# Patient Record
Sex: Male | Born: 1965 | Race: Black or African American | Hispanic: No | Marital: Single | Smoking: Current some day smoker
Health system: Southern US, Community
[De-identification: ages and names within clinical notes are randomized; demographics above are authoritative.]

## PROBLEM LIST (undated history)

## (undated) DIAGNOSIS — R569 Unspecified convulsions: Secondary | ICD-10-CM

## (undated) DIAGNOSIS — F439 Reaction to severe stress, unspecified: Secondary | ICD-10-CM

## (undated) DIAGNOSIS — F419 Anxiety disorder, unspecified: Secondary | ICD-10-CM

## (undated) HISTORY — DX: Unspecified convulsions: R56.9

## (undated) HISTORY — DX: Anxiety disorder, unspecified: F41.9

## (undated) HISTORY — DX: Reaction to severe stress, unspecified: F43.9

---

## 2005-12-07 ENCOUNTER — Other Ambulatory Visit: Payer: Self-pay

## 2005-12-07 ENCOUNTER — Emergency Department: Payer: Self-pay | Admitting: Internal Medicine

## 2006-03-25 ENCOUNTER — Emergency Department: Payer: Self-pay | Admitting: Emergency Medicine

## 2006-03-25 ENCOUNTER — Other Ambulatory Visit: Payer: Self-pay

## 2006-05-14 ENCOUNTER — Emergency Department: Payer: Self-pay | Admitting: General Practice

## 2006-05-14 ENCOUNTER — Other Ambulatory Visit: Payer: Self-pay

## 2007-09-30 ENCOUNTER — Emergency Department: Payer: Self-pay | Admitting: Emergency Medicine

## 2008-03-13 IMAGING — CT CT HEAD WITHOUT CONTRAST
2 series · 16 of 30 positions shown, 20 images · non-contrast
Comparison: none

REASON FOR EXAM: syncope,seizure
COMMENTS:

[Series 2: without · axial · non-contrast · 0.43mm/px · z∈[+553,+678]mm · 13 of 31 slices shown, 17 images]
[im 3/31  brain]
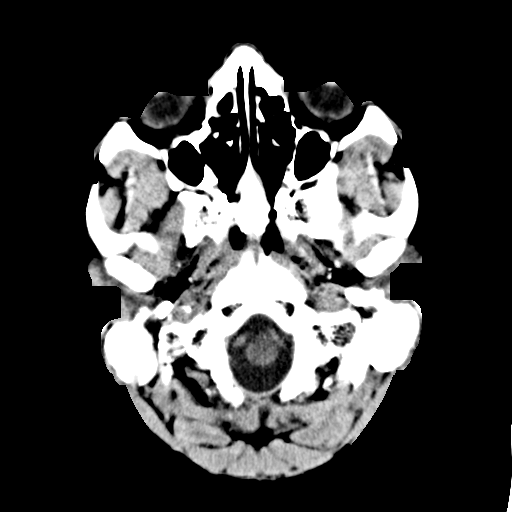
[im 3/31  bone]
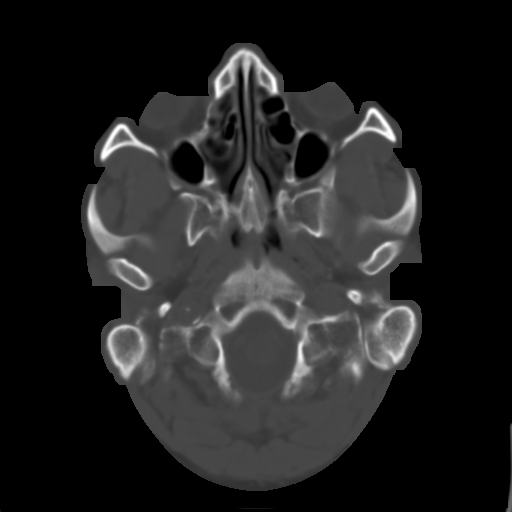
[im 5/31  brain]
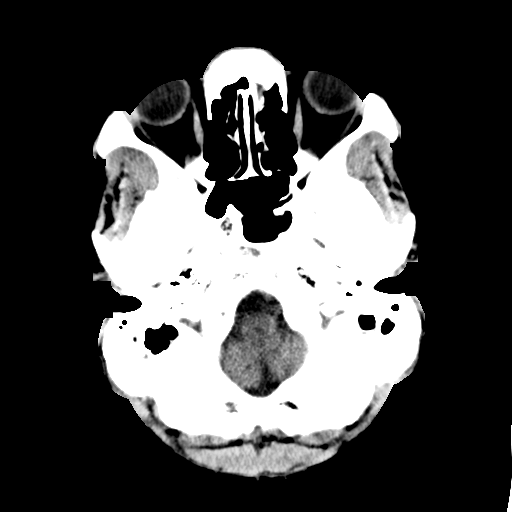
[im 7/31  brain]
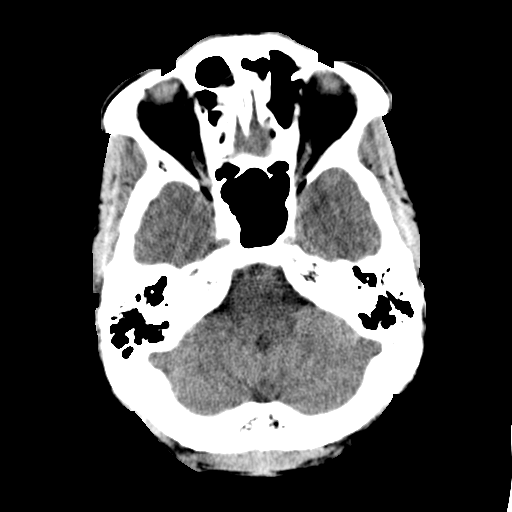
[im 9/31  brain]
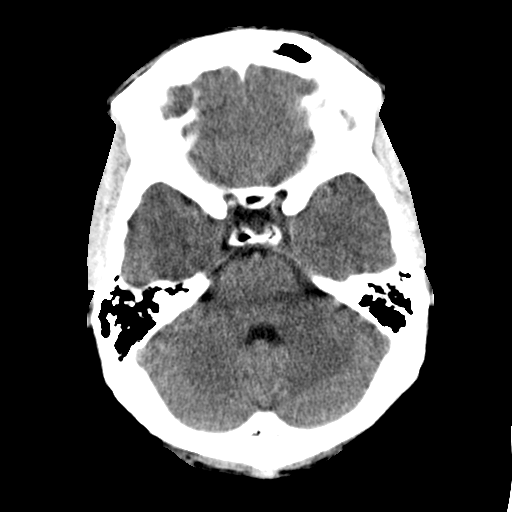
[im 11/31  brain]
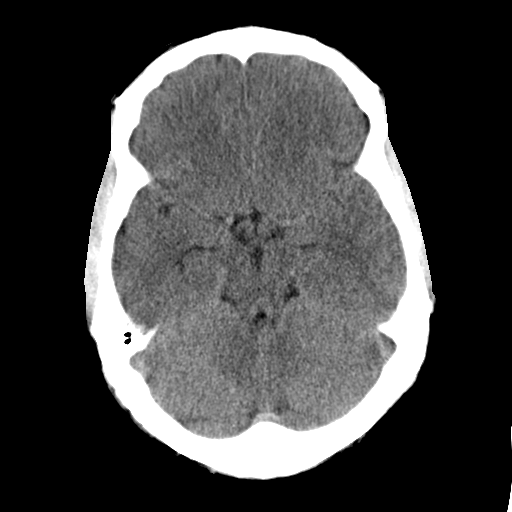
[im 11/31  bone]
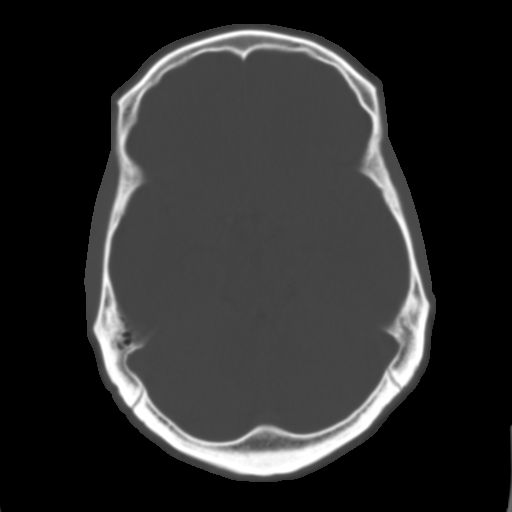
[im 13/31  brain]
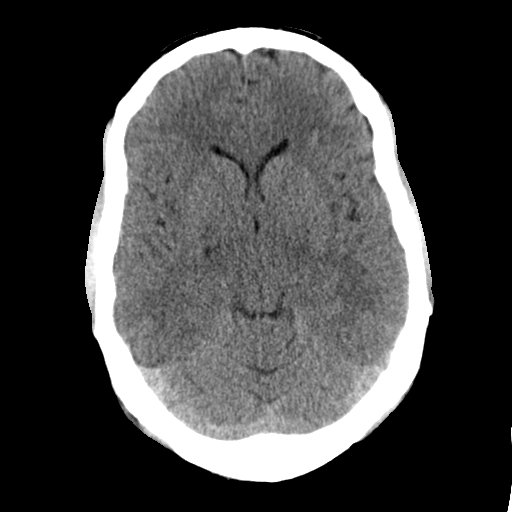
[im 16/31  brain]
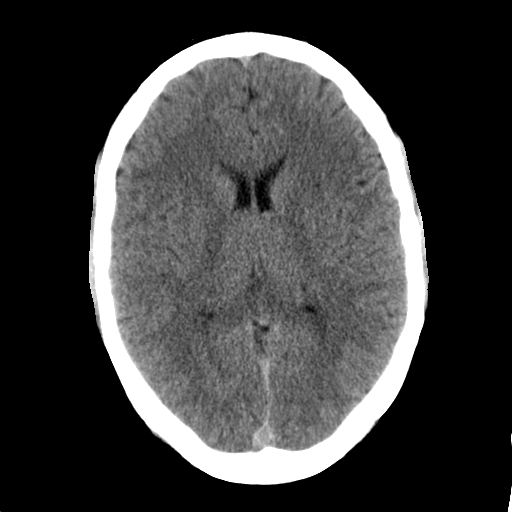
[im 18/31  brain]
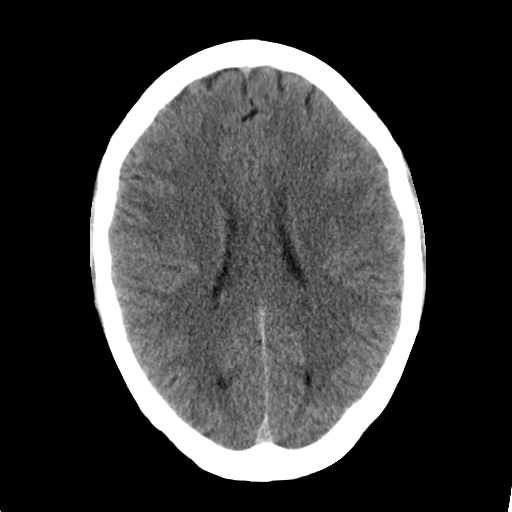
[im 20/31  brain]
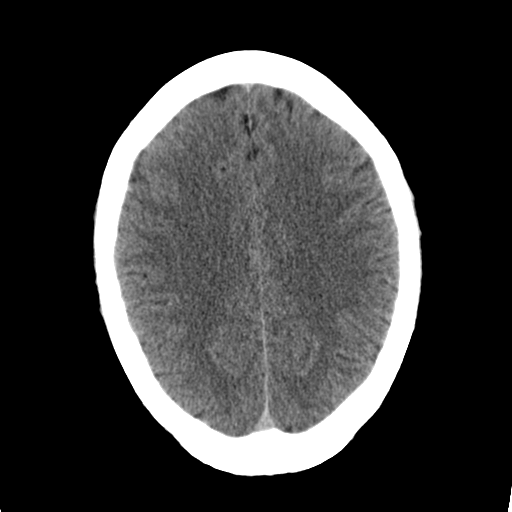
[im 20/31  bone]
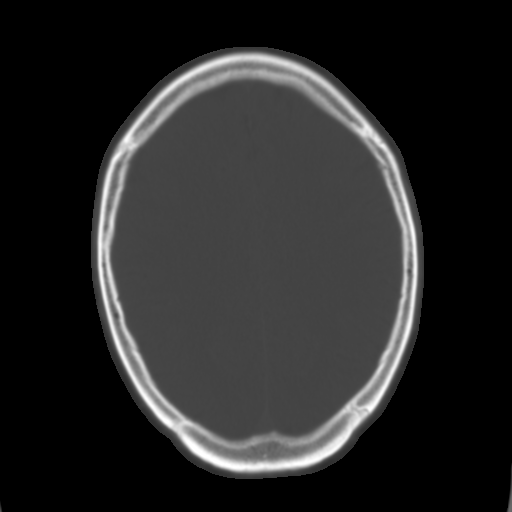
[im 22/31  brain]
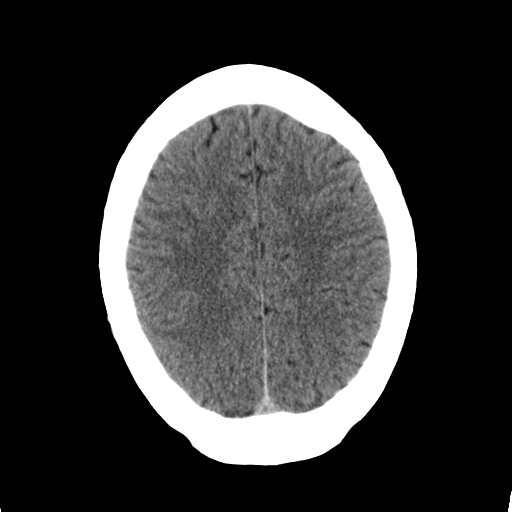
[im 24/31  brain]
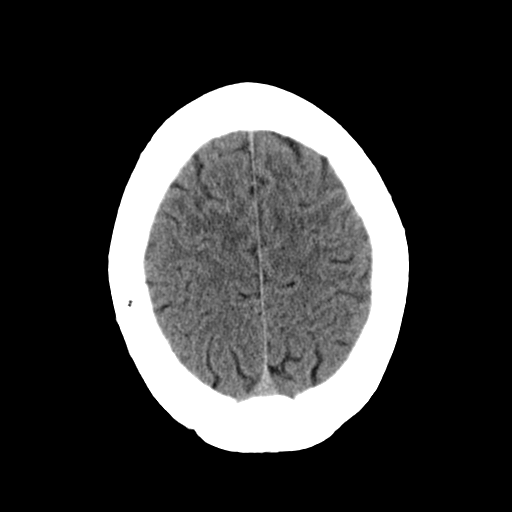
[im 26/31  brain]
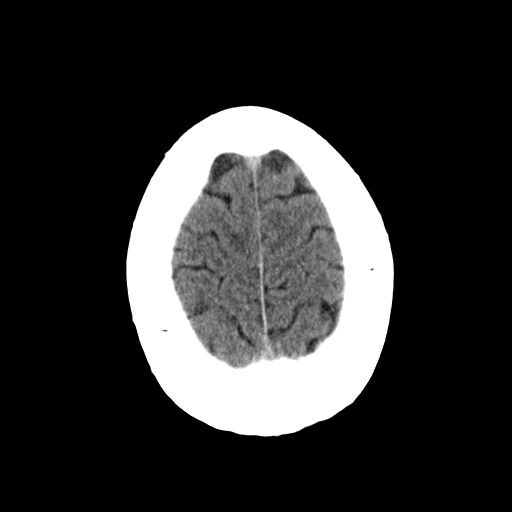
[im 28/31  brain]
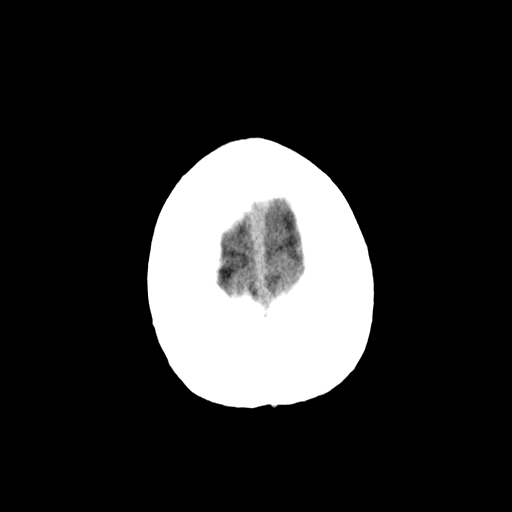
[im 28/31  bone]
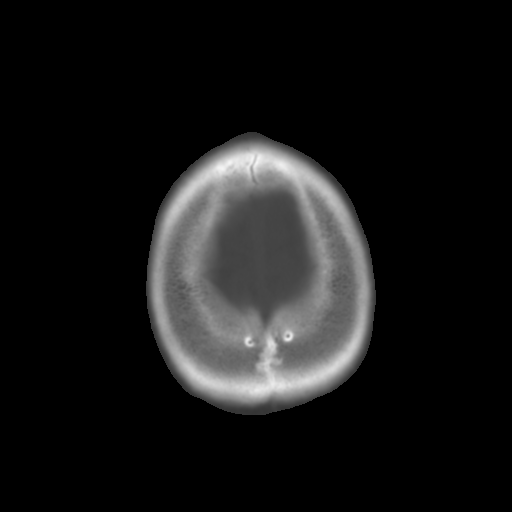

[Series 3: bone · axial · 0.43mm/px · z∈[+553,+593]mm · 3 of 31 slices shown]
[im 3/31  bone]
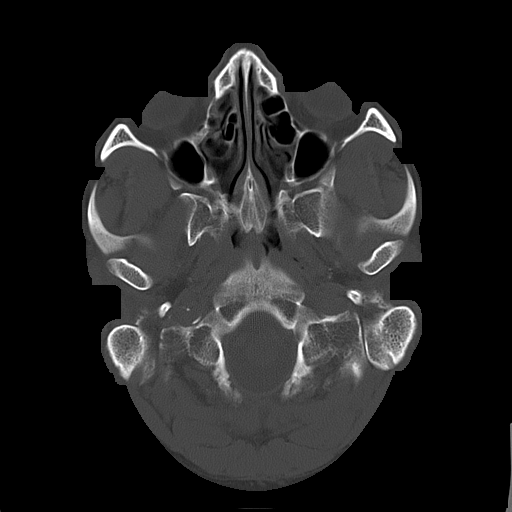
[im 7/31  bone]
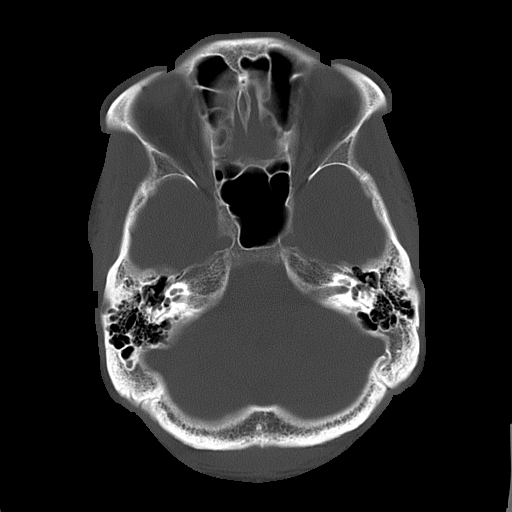
[im 11/31  bone]
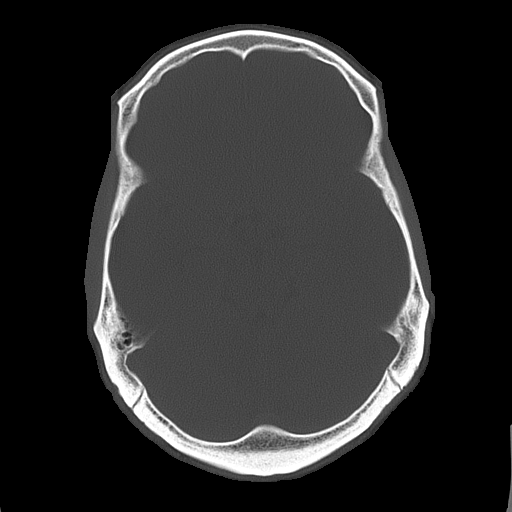

[16 of 30 positions shown; findings below may reference images not displayed]

PROCEDURE:     CT  - CT HEAD WITHOUT CONTRAST  - March 25, 2006  [DATE]

RESULT:     There is no evidence of intra-axial nor extra-axial fluid
collections nor evidence of acute hemorrhage. No secondary signs are
appreciated to suggest mass effect, subacute or chronic infarction.  The
visualized bony skeleton evaluated with bone windowing demonstrates no
evidence of fracture or dislocation. Dr. Bre Kaban of the Emergency Room was
informed of these findings at the time of the initial interpretation.
IMPRESSION: 1)Unremarkable Head CT as described above.

## 2008-03-13 IMAGING — CR DG CHEST 1V PORT
1 series · 1 of 1 positions shown · non-contrast
Comparison: none

REASON FOR EXAM: Syncope
COMMENTS:

PROCEDURE:     DXR - DXR PORTABLE CHEST SINGLE VIEW  - March 25, 2006  [DATE]
RESULT:        Comparison is made to the study of 12/07/05.  The lungs are
clear.  Cardiac monitoring electrodes are present.  There is no infiltrate.
There is no effusion.  There is no pneumothorax.

[view not recorded]
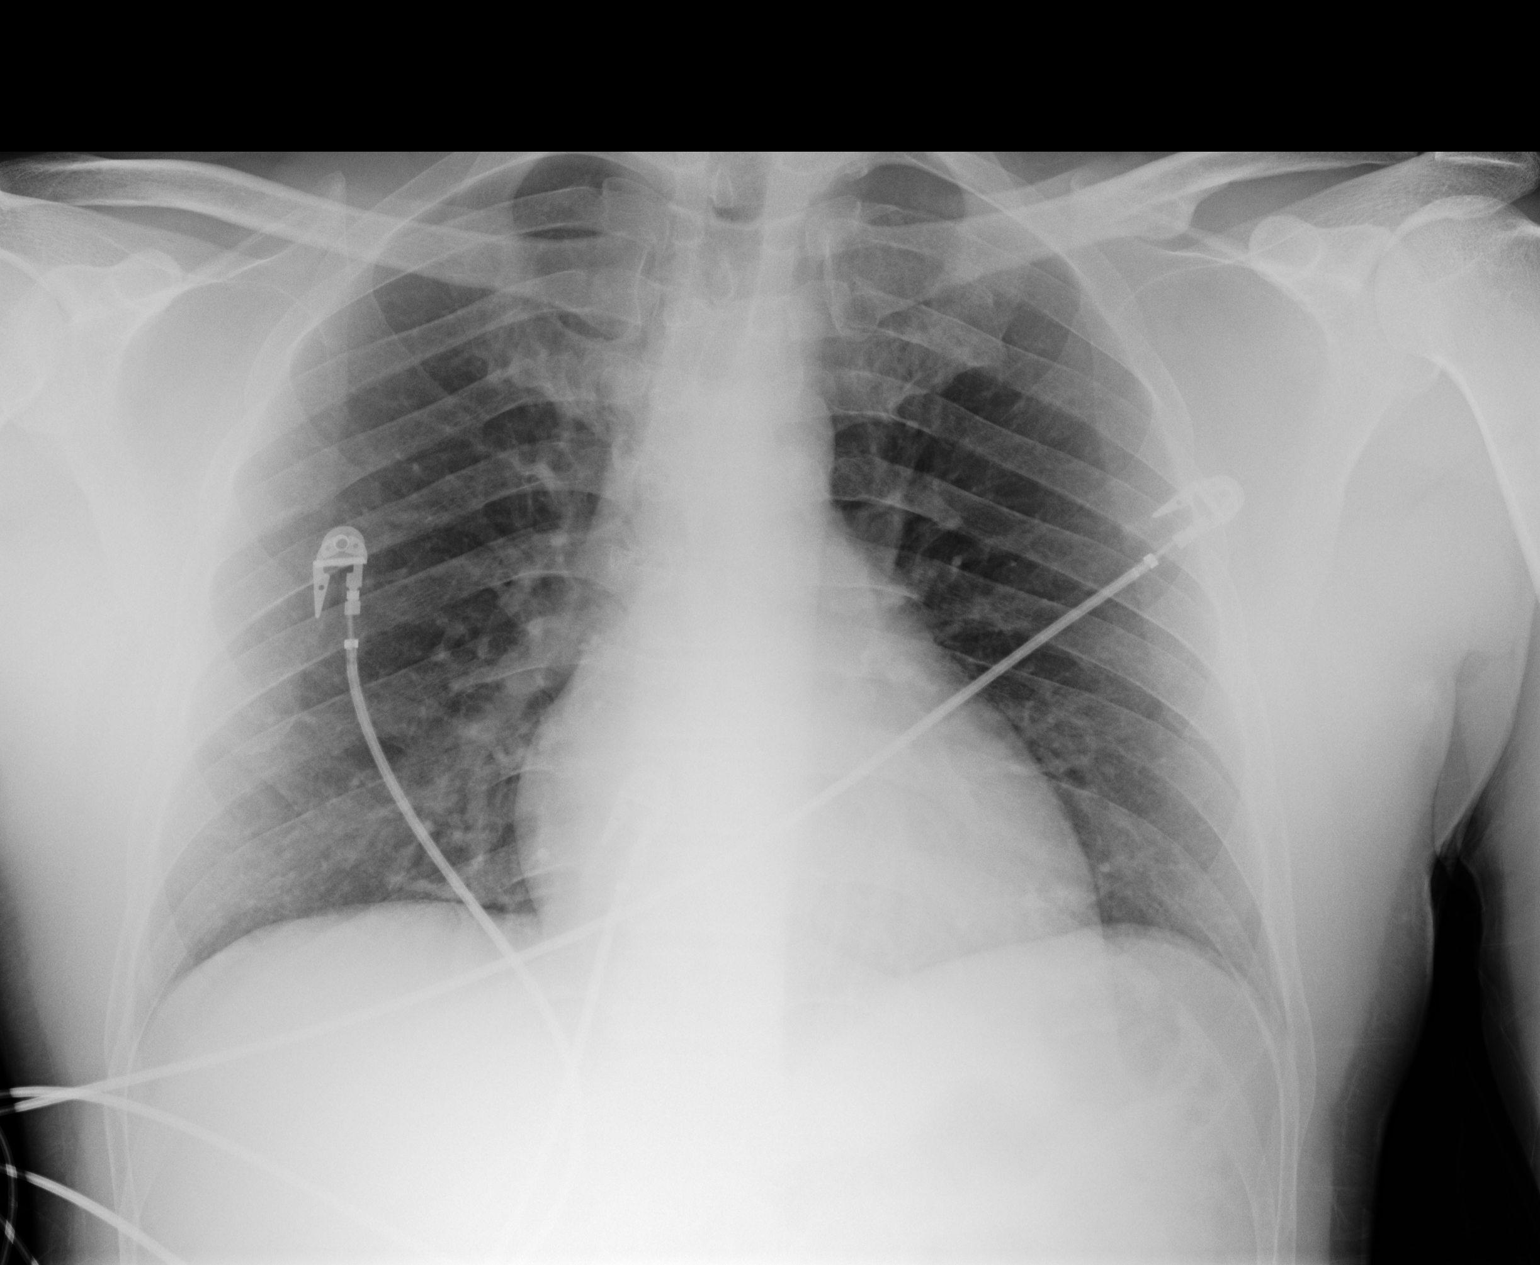

[1 of 1 positions shown; findings below may reference images not displayed]

IMPRESSION: No acute cardiopulmonary disease.  Stable appearance.

## 2008-08-02 ENCOUNTER — Emergency Department: Payer: Self-pay | Admitting: Emergency Medicine

## 2008-12-04 ENCOUNTER — Emergency Department: Payer: Self-pay | Admitting: Emergency Medicine

## 2009-06-05 ENCOUNTER — Emergency Department: Payer: Self-pay | Admitting: Emergency Medicine

## 2009-06-14 ENCOUNTER — Emergency Department: Payer: Self-pay | Admitting: Emergency Medicine

## 2009-06-19 ENCOUNTER — Emergency Department: Payer: Self-pay | Admitting: Emergency Medicine

## 2009-06-20 ENCOUNTER — Emergency Department: Payer: Self-pay | Admitting: Internal Medicine

## 2012-12-25 ENCOUNTER — Emergency Department: Payer: Self-pay | Admitting: Internal Medicine

## 2013-01-31 DIAGNOSIS — I861 Scrotal varices: Secondary | ICD-10-CM | POA: Insufficient documentation

## 2013-01-31 DIAGNOSIS — N419 Inflammatory disease of prostate, unspecified: Secondary | ICD-10-CM | POA: Insufficient documentation

## 2013-01-31 DIAGNOSIS — E291 Testicular hypofunction: Secondary | ICD-10-CM | POA: Insufficient documentation

## 2013-01-31 DIAGNOSIS — N50819 Testicular pain, unspecified: Secondary | ICD-10-CM | POA: Insufficient documentation

## 2013-05-18 ENCOUNTER — Ambulatory Visit: Payer: Self-pay

## 2014-04-05 ENCOUNTER — Ambulatory Visit: Payer: Self-pay | Admitting: Cardiovascular Disease

## 2014-04-15 DIAGNOSIS — F22 Delusional disorders: Secondary | ICD-10-CM | POA: Insufficient documentation

## 2014-04-16 ENCOUNTER — Encounter: Payer: Self-pay | Admitting: *Deleted

## 2014-04-17 ENCOUNTER — Encounter: Payer: Self-pay | Admitting: Cardiovascular Disease

## 2014-04-17 ENCOUNTER — Ambulatory Visit: Payer: Self-pay | Admitting: Cardiovascular Disease

## 2014-05-11 DIAGNOSIS — F23 Brief psychotic disorder: Secondary | ICD-10-CM | POA: Insufficient documentation

## 2015-05-07 IMAGING — CR DG SHOULDER 3+V*L*
1 series · 4 of 4 positions shown · non-contrast
Comparison: 03/25/2006

CLINICAL DATA: Left shoulder pain.

EXAM:
DG SHOULDER 3+VIEWS LEFT

[Series 1: grashey · 0.17mm/px · 4 of 4 slices shown]
[im 1/4]
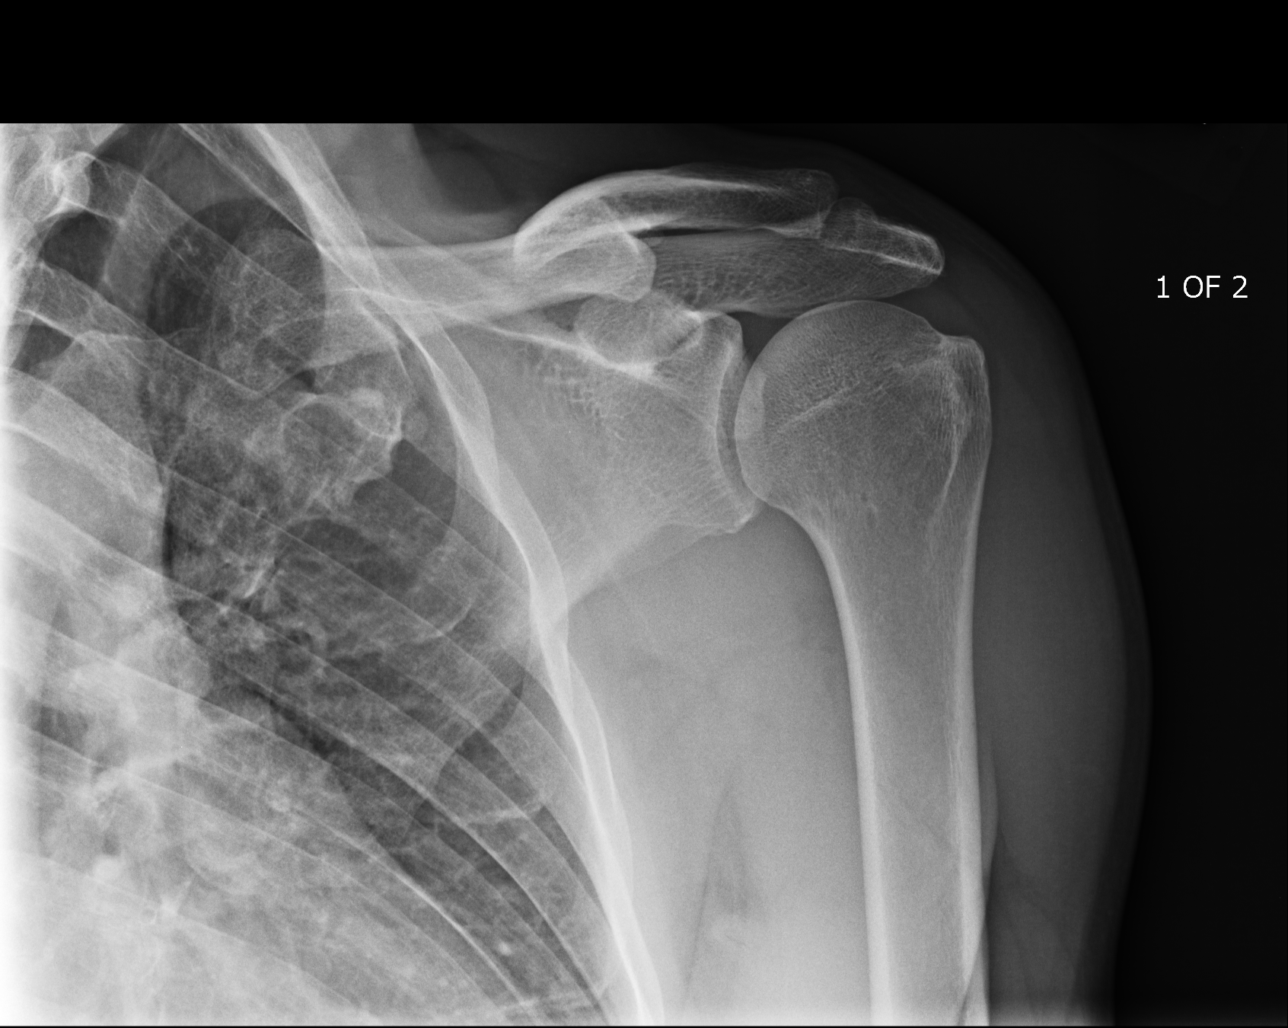
[im 2/4]
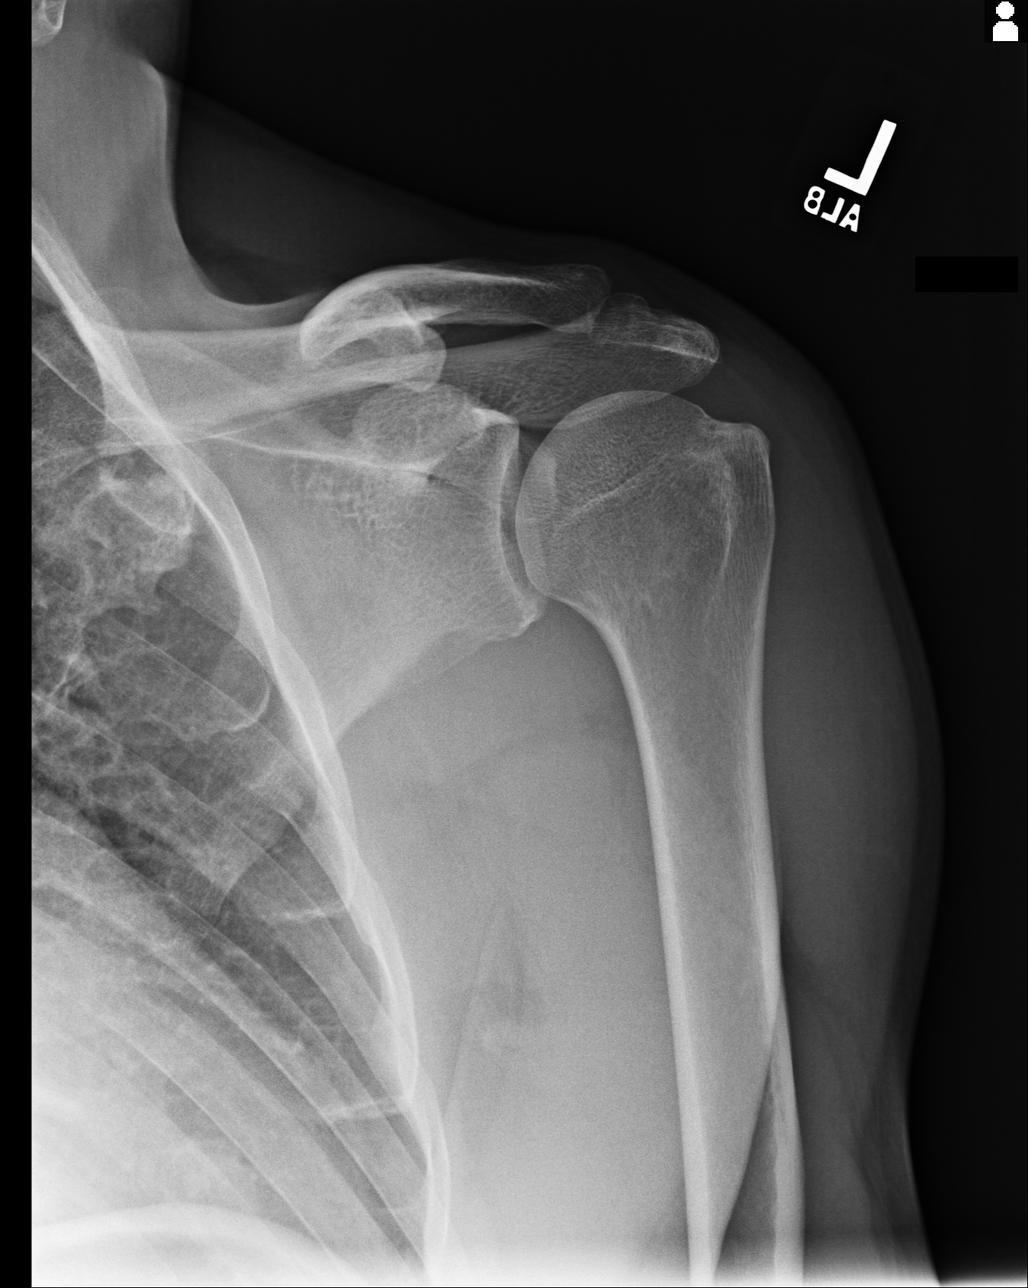
[im 3/4]
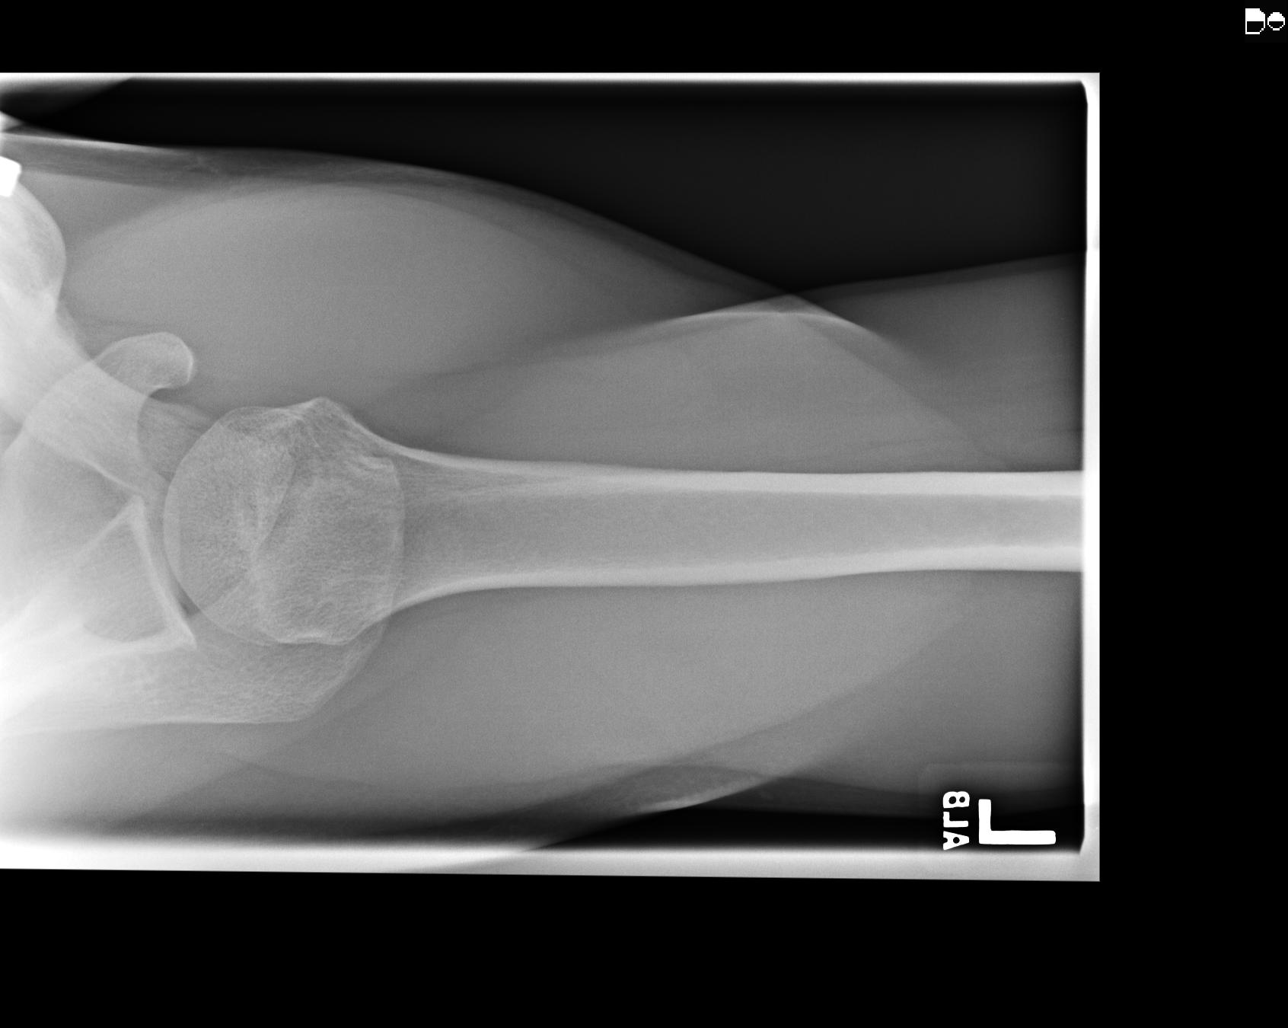
[im 4/4]
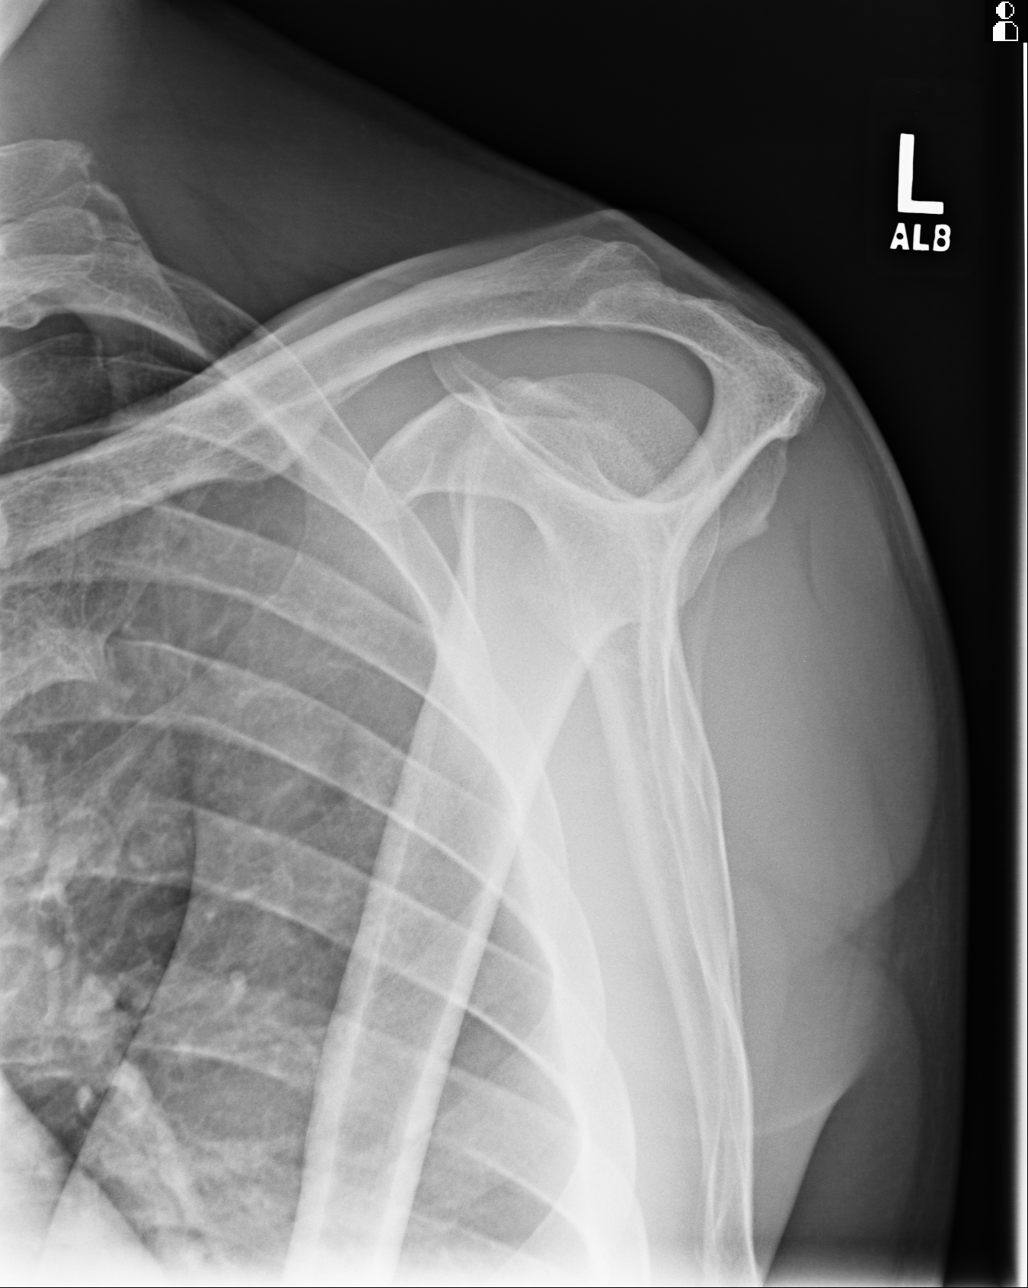

[4 of 4 positions shown; findings below may reference images not displayed]

FINDINGS: There is a chronic healed fracture deformity involving the mid shaft
of the left clavicle. No acute fractures or subluxations identified.
No radiopaque foreign bodies are soft tissue calcifications.
IMPRESSION: 1. No acute findings.

## 2018-03-29 ENCOUNTER — Ambulatory Visit: Payer: Self-pay

## 2018-03-29 NOTE — Telephone Encounter (Signed)
Returned pt call who stated he was having rt hand and wrist pain for 2 weeks. He says their is some swelling and weakness to his hand. He said lately he has had pain to legs as well  He feels the pain may be traveling around to different joints.  He states that yesterday he had "sparkles" in his vision. He also reports a weight loss of about 15 lbs over a few months that is unexplained. He said he also has some residual testicle pain but has been treated for this by another provider. Per flow Coordinator at [facility]  office pt will go to urgent care for evaluation of these symptoms. Pt agrees to disposition. Care advice given per protocol. Pt verbalized understanding of all. Reason for Disposition . [1] SEVERE pain (e.g., excruciating, unable to use hand at all) AND [2] not improved after 2 hours of pain medicine  Answer Assessment - Initial Assessment Questions 1. ONSET: "When did the pain start?"     2 weeks ago 2. LOCATION: "Where is the pain located?"     rt 3. PAIN: "How bad is the pain?" (Scale 1-10; or mild, moderate, severe)   - MILD (1-3): doesn't interfere with normal activities   - MODERATE (4-7): interferes with normal activities (e.g., work or school) or awakens from sleep   - SEVERE (8-10): excruciating pain, unable to use hand at all     8 severe 4. WORK OR EXERCISE: "Has there been any recent work or exercise that involved this part of the body?"     Was doing push ups no pressure to rist or hand 5. CAUSE: "What do you think is causing the pain?"     unsure 6. AGGRAVATING FACTORS: "What makes the pain worse?" (e.g., using computer)     Using the hand at anytime 7. OTHER SYMPTOMS: "Do you have any other symptoms?" (e.g., neck pain, swelling, rash, numbness, fever)     Swelling  Weakness weight loss approx15lbs without trying 8. PREGNANCY: "Is there any chance you are pregnant?" "When was your last menstrual period?"     N/A  Protocols used: HAND AND WRIST  PAIN-A-AH

## 2018-04-18 ENCOUNTER — Ambulatory Visit (INDEPENDENT_AMBULATORY_CARE_PROVIDER_SITE_OTHER): Payer: PRIVATE HEALTH INSURANCE | Admitting: Family Medicine

## 2018-04-18 ENCOUNTER — Encounter: Payer: Self-pay | Admitting: Family Medicine

## 2018-04-18 VITALS — BP 116/62 | HR 86 | Temp 100.0°F | Ht 72.0 in | Wt 187.1 lb

## 2018-04-18 DIAGNOSIS — Z114 Encounter for screening for human immunodeficiency virus [HIV]: Secondary | ICD-10-CM | POA: Diagnosis not present

## 2018-04-18 DIAGNOSIS — Z23 Encounter for immunization: Secondary | ICD-10-CM

## 2018-04-18 DIAGNOSIS — Z7689 Persons encountering health services in other specified circumstances: Secondary | ICD-10-CM | POA: Diagnosis not present

## 2018-04-18 DIAGNOSIS — F419 Anxiety disorder, unspecified: Secondary | ICD-10-CM | POA: Diagnosis not present

## 2018-04-18 DIAGNOSIS — Z1211 Encounter for screening for malignant neoplasm of colon: Secondary | ICD-10-CM

## 2018-04-18 DIAGNOSIS — R509 Fever, unspecified: Secondary | ICD-10-CM

## 2018-04-18 DIAGNOSIS — Z87898 Personal history of other specified conditions: Secondary | ICD-10-CM

## 2018-04-18 DIAGNOSIS — M2550 Pain in unspecified joint: Secondary | ICD-10-CM

## 2018-04-18 MED ORDER — DOXYCYCLINE HYCLATE 100 MG PO TABS: 100.0000 mg | ORAL_TABLET | Freq: Two times a day (BID) | ORAL | 0 refills | Status: DC

## 2018-04-18 NOTE — Progress Notes (Signed)
BP 116/62 (BP Location: Right Arm, Patient Position: Sitting, Cuff Size: Normal)   Pulse 86   Temp 100 F (37.8 C)   Ht 6' (1.829 m)   Wt 187 lb 2 oz (84.9 kg)   SpO2 98%   BMI 25.38 kg/m    Subjective:    Patient ID: [name], male    DOB: 1966/06/23, 52 y.o.   MRN: [id number]  HPI: ASTIN SAYRE is a 52 y.o. male  Chief Complaint  Patient presents with  . Joint Pain    all over  . Ear Pain    left    Patient presents today to establish care.   Main concern today is about a month of generalized body aches and joint pains, intermittent fevers, fatigue. No congestion, rashes, cough, CP, SOB, known tick bites, recent travel. These are all new issues for him. Taking NSAIDs with mild temporary relief.   Hx of seizures, started over 20 years ago with last seizure around 2002. Under good control without medications. Believes previous seizures were stress related.   Hx of anxiety, seems situational when he's under a lot of stress. Feels things are under good control right now.   Last CPE was several years ago.   Relevant past medical, surgical, family and social history reviewed and updated as indicated. Interim medical history since our last visit reviewed. Allergies and medications reviewed and updated.  Review of Systems  Per HPI unless specifically indicated above     Objective:    BP 116/62 (BP Location: Right Arm, Patient Position: Sitting, Cuff Size: Normal)   Pulse 86   Temp 100 F (37.8 C)   Ht 6' (1.829 m)   Wt 187 lb 2 oz (84.9 kg)   SpO2 98%   BMI 25.38 kg/m   Wt Readings from Last 3 Encounters:  04/18/18 187 lb 2 oz (84.9 kg)    Physical Exam  Constitutional: He is oriented to person, place, and time. He appears well-developed and well-nourished. No distress.  HENT:  Head: Atraumatic.  Eyes: Conjunctivae and EOM are normal.  Neck: Normal range of motion. Neck supple.  Cardiovascular: Normal rate, regular rhythm and normal heart  sounds.  Pulmonary/Chest: Effort normal and breath sounds normal. No respiratory distress. He has no wheezes.  Musculoskeletal: Normal range of motion. He exhibits no edema or tenderness.  Lymphadenopathy:    He has no cervical adenopathy.  Neurological: He is alert and oriented to person, place, and time.  Skin: Skin is warm and dry. No rash noted.  Psychiatric: He has a normal mood and affect. His behavior is normal.  Nursing note and vitals reviewed.   Results for orders placed or performed in visit on 04/18/18  HIV Antibody (routine testing w rflx)  Result Value Ref Range   HIV Screen 4th Generation wRfx Non Reactive Non Reactive  Rocky mtn spotted fvr abs pnl(IgG+IgM)  Result Value Ref Range   RMSF IgG WILL FOLLOW    RMSF IgM WILL FOLLOW   Lyme Ab/Western Blot Reflex  Result Value Ref Range   Lyme IgG/IgM Ab <0.91 0.00 - 0.90 ISR   LYME DISEASE AB, QUANT, IGM <0.80 0.00 - 0.79 index  Sed Rate (ESR)  Result Value Ref Range   Sed Rate 3 0 - 30 mm/hr  Rheumatoid Factor  Result Value Ref Range   Rhuematoid fact SerPl-aCnc <10.0 0.0 - 13.9 IU/mL  ANA w/Reflex  Result Value Ref Range   Anti Nuclear Antibody(ANA) Negative Negative  Assessment & Plan:   Problem List Items Addressed This Visit      Other   Anxiety - Primary    Under good control currently without intervention. Continue stress relief strategies with exercise, deep breathing. Follow up if feeling like things are no longer at a manageable place      History of seizures    Seizure free for almost 20 years, no longer managed by Neurology.        Other Visit Diagnoses    Encounter to establish care       Screening for HIV (human immunodeficiency virus)       Relevant Orders   HIV Antibody (routine testing w rflx) (Completed)   Immunization due       Screening for colon cancer       Relevant Orders   Ambulatory referral to Gastroenterology   Arthralgia, unspecified joint       Will run labs to r/o  tick illness, autoimmune/inflammatory causes. Start doxycycline in case tick bourne given consistency of sxs. Basic labs soon at CPE   Relevant Orders   Rocky mtn spotted fvr abs pnl(IgG+IgM) (Completed)   Lyme Ab/Western Blot Reflex (Completed)   Sed Rate (ESR) (Completed)   Rheumatoid Factor (Completed)   ANA w/Reflex (Completed)   Fever, unspecified fever cause       Relevant Orders   Veritor Flu A/B Waived       Follow up plan: Return for CPE.

## 2018-04-21 DIAGNOSIS — F419 Anxiety disorder, unspecified: Secondary | ICD-10-CM | POA: Insufficient documentation

## 2018-04-21 DIAGNOSIS — Z87898 Personal history of other specified conditions: Secondary | ICD-10-CM | POA: Insufficient documentation

## 2018-04-21 LAB — HIV ANTIBODY (ROUTINE TESTING W REFLEX): HIV SCREEN 4TH GENERATION: NONREACTIVE

## 2018-04-21 LAB — RHEUMATOID FACTOR: Rhuematoid fact SerPl-aCnc: <10 IU/mL (ref 0.0–13.9)

## 2018-04-21 LAB — LYME AB/WESTERN BLOT REFLEX
LYME DISEASE AB, QUANT, IGM: <0.8 index (ref 0.00–0.79)
Lyme IgG/IgM Ab: <0.91 {ISR} (ref 0.00–0.90)

## 2018-04-21 LAB — SEDIMENTATION RATE: Sed Rate: 3 mm/hr (ref 0–30)

## 2018-04-21 LAB — ROCKY MTN SPOTTED FVR ABS PNL(IGG+IGM)
RMSF IgG: NEGATIVE
RMSF IgM: 0.21 index (ref 0.00–0.89)

## 2018-04-21 LAB — ANA W/REFLEX: ANA: NEGATIVE

## 2018-04-21 NOTE — Patient Instructions (Signed)
Follow up for CPE 

## 2018-04-21 NOTE — Assessment & Plan Note (Signed)
Under good control currently without intervention. Continue stress relief strategies with exercise, deep breathing. Follow up if feeling like things are no longer at a manageable place

## 2018-04-21 NOTE — Assessment & Plan Note (Signed)
Seizure free for almost 20 years, no longer managed by Neurology.

## 2018-04-22 ENCOUNTER — Encounter: Payer: Self-pay | Admitting: Family Medicine

## 2018-04-22 ENCOUNTER — Other Ambulatory Visit: Payer: Self-pay

## 2018-04-22 DIAGNOSIS — Z1211 Encounter for screening for malignant neoplasm of colon: Secondary | ICD-10-CM

## 2018-04-28 ENCOUNTER — Other Ambulatory Visit: Payer: Self-pay

## 2018-04-28 MED ORDER — NA SULFATE-K SULFATE-MG SULF 17.5-3.13-1.6 GM/177ML PO SOLN: 1.0000 | Freq: Once | ORAL | 0 refills | Status: DC

## 2018-04-28 MED ORDER — PEG 3350-KCL-NABCB-NACL-NASULF 236 G PO SOLR: 4000.0000 mL | Freq: Once | ORAL | 0 refills | Status: AC

## 2018-05-03 ENCOUNTER — Ambulatory Visit
Admission: RE | Admit: 2018-05-03 | Discharge: 2018-05-03 | Disposition: A | Discharge status: Home / Self Care | Payer: PRIVATE HEALTH INSURANCE | Source: Ambulatory Visit | Attending: Gastroenterology | Admitting: Gastroenterology

## 2018-05-03 ENCOUNTER — Ambulatory Visit: Payer: PRIVATE HEALTH INSURANCE | Admitting: Certified Registered Nurse Anesthetist

## 2018-05-03 ENCOUNTER — Encounter
Admission: RE | Disposition: A | Discharge status: Home / Self Care | Payer: Self-pay | Source: Ambulatory Visit | Attending: Gastroenterology

## 2018-05-03 DIAGNOSIS — Z1211 Encounter for screening for malignant neoplasm of colon: Secondary | ICD-10-CM | POA: Diagnosis present

## 2018-05-03 DIAGNOSIS — D123 Benign neoplasm of transverse colon: Secondary | ICD-10-CM | POA: Diagnosis not present

## 2018-05-03 DIAGNOSIS — F419 Anxiety disorder, unspecified: Secondary | ICD-10-CM | POA: Diagnosis not present

## 2018-05-03 DIAGNOSIS — D122 Benign neoplasm of ascending colon: Secondary | ICD-10-CM | POA: Diagnosis not present

## 2018-05-03 DIAGNOSIS — F17290 Nicotine dependence, other tobacco product, uncomplicated: Secondary | ICD-10-CM | POA: Insufficient documentation

## 2018-05-03 DIAGNOSIS — Z79899 Other long term (current) drug therapy: Secondary | ICD-10-CM | POA: Insufficient documentation

## 2018-05-03 HISTORY — PX: COLONOSCOPY WITH PROPOFOL: SHX5780

## 2018-05-03 SURGERY — COLONOSCOPY WITH PROPOFOL
Anesthesia: General

## 2018-05-03 MED ORDER — PROPOFOL 10 MG/ML IV BOLUS
INTRAVENOUS | Status: DC | PRN
  Administered 2018-05-03: 50 mg via INTRAVENOUS
  Administered 2018-05-03: 20 mg via INTRAVENOUS
  Administered 2018-05-03 (×2): 10 mg via INTRAVENOUS

## 2018-05-03 MED ORDER — PROPOFOL 500 MG/50ML IV EMUL
INTRAVENOUS | Status: AC
  Filled 2018-05-03: qty 50

## 2018-05-03 MED ORDER — LIDOCAINE HCL (CARDIAC) PF 100 MG/5ML IV SOSY
PREFILLED_SYRINGE | INTRAVENOUS | Status: DC | PRN
  Administered 2018-05-03: 50 mg via INTRAVENOUS

## 2018-05-03 MED ORDER — PROPOFOL 500 MG/50ML IV EMUL
INTRAVENOUS | Status: DC | PRN
  Administered 2018-05-03: 150 ug/kg/min via INTRAVENOUS

## 2018-05-03 MED ORDER — SODIUM CHLORIDE 0.9 % IV SOLN
INTRAVENOUS | Status: DC
  Administered 2018-05-03: 1000 mL via INTRAVENOUS

## 2018-05-03 MED ORDER — LIDOCAINE HCL (PF) 2 % IJ SOLN
INTRAMUSCULAR | Status: AC
  Filled 2018-05-03: qty 10

## 2018-05-03 NOTE — Transfer of Care (Addendum)
Immediate Anesthesia Transfer of Care Note  Patient: Emile T Decaire  Procedure(s) Performed: COLONOSCOPY WITH PROPOFOL (N/A )  Patient Location: PACU  Anesthesia Type:General  Level of Consciousness: awake  Airway & Oxygen Therapy: Patient Spontanous Breathing and Patient connected to nasal cannula oxygen  Post-op Assessment: Report given to RN and Post -op Vital signs reviewed and stable  Post vital signs: Reviewed and stable  Last Vitals:  Vitals Value Taken Time  BP 109/74 05/03/2018 10:41 AM  Temp 36.1 C 05/03/2018 10:41 AM  Pulse 69 05/03/2018 10:42 AM  Resp 24 05/03/2018 10:42 AM  SpO2 100 % 05/03/2018 10:42 AM  Vitals shown include unvalidated device data.  Last Pain:  Vitals:   05/03/18 1041  TempSrc: Tympanic  PainSc: 0-No pain         Complications: No apparent anesthesia complications

## 2018-05-03 NOTE — Anesthesia Post-op Follow-up Note (Signed)
Anesthesia QCDR form completed.        

## 2018-05-03 NOTE — Anesthesia Postprocedure Evaluation (Signed)
Anesthesia Post Note  Patient: Nhan T Meader  Procedure(s) Performed: COLONOSCOPY WITH PROPOFOL (N/A )  Patient location during evaluation: Endoscopy Anesthesia Type: General Level of consciousness: awake and alert Pain management: pain level controlled Vital Signs Assessment: post-procedure vital signs reviewed and stable Respiratory status: spontaneous breathing, nonlabored ventilation, respiratory function stable and patient connected to nasal cannula oxygen Cardiovascular status: blood pressure returned to baseline and stable Postop Assessment: no apparent nausea or vomiting Anesthetic complications: no     Last Vitals:  Vitals:   05/03/18 0900 05/03/18 1041  BP: 102/72 109/74  Pulse: 70 73  Resp: 20 18  Temp: (!) 35.7 C (!) 36.1 C  SpO2: 100% 100%    Last Pain:  Vitals:   05/03/18 1124  TempSrc:   PainSc: 0-No pain                 Precious Haws Piscitello

## 2018-05-03 NOTE — Op Note (Signed)
[facility] Gastroenterology Patient Name: Michael Perkins Procedure Date: 05/03/2018 9:48 AM MRN: [id number] Account #: [id number] Date of Birth: 17-Oct-1965 Admit Type: Outpatient Age: 52 Room: [facility] ENDO ROOM 1 Gender: Male Note Status: Finalized Procedure:            Colonoscopy Indications:          Screening for colorectal malignant neoplasm Providers:            Jonathon Bellows MD, MD Referring MD:         Guadalupe Maple, MD (Referring MD) Medicines:            Monitored Anesthesia Care Complications:        No immediate complications. Procedure:            Pre-Anesthesia Assessment:                       - Prior to the procedure, a History and Physical was                        performed, and patient medications, allergies and                        sensitivities were reviewed. The patient's tolerance of                        previous anesthesia was reviewed.                       - The risks and benefits of the procedure and the                        sedation options and risks were discussed with the                        patient. All questions were answered and informed                        consent was obtained.                       - ASA Grade Assessment: II - A patient with mild                        systemic disease.                       After obtaining informed consent, the colonoscope was                        passed under direct vision. Throughout the procedure,                        the patient's blood pressure, pulse, and oxygen                        saturations were monitored continuously. The                        Colonoscope was introduced through the anus and  advanced to the the cecum, identified by the                        appendiceal orifice, IC valve and transillumination.                        The colonoscopy was performed without difficulty. The                        patient tolerated the procedure  well. The quality of                        the bowel preparation was good. Findings:      The perianal and digital rectal examinations were normal.      Two sessile polyps were found in the ascending colon. The polyps were 3       to 4 mm in size. These polyps were removed with a cold biopsy forceps.       Resection and retrieval were complete.      A 5 mm polyp was found in the distal transverse colon. The polyp was       sessile. The polyp was removed with a cold snare. Resection and       retrieval were complete.      The exam was otherwise without abnormality on direct and retroflexion       views. Impression:           - Two 3 to 4 mm polyps in the ascending colon, removed                        with a cold biopsy forceps. Resected and retrieved.                       - One 5 mm polyp in the distal transverse colon,                        removed with a cold snare. Resected and retrieved.                       - The examination was otherwise normal on direct and                        retroflexion views. Recommendation:       - Discharge patient to home (with escort).                       - Resume previous diet.                       - Continue present medications.                       - Await pathology results.                       - Repeat colonoscopy in 3 - 5 years for surveillance                        based on pathology results. Procedure Code(s):    --- Professional ---  45385, Colonoscopy, flexible; with removal of tumor(s),                        polyp(s), or other lesion(s) by snare technique                       45380, 59, Colonoscopy, flexible; with biopsy, single                        or multiple Diagnosis Code(s):    --- Professional ---                       Z12.11, Encounter for screening for malignant neoplasm                        of colon                       D12.2, Benign neoplasm of ascending colon                       D12.3,  Benign neoplasm of transverse colon (hepatic                        flexure or splenic flexure) CPT copyright 2018 American Medical Association. All rights reserved. The codes documented in this report are preliminary and upon coder review may  be revised to meet current compliance requirements. Jonathon Bellows, MD Jonathon Bellows MD, MD 05/03/2018 10:38:02 AM This report has been signed electronically. Number of Addenda: 0 Note Initiated On: 05/03/2018 9:48 AM Scope Withdrawal Time: 0 hours 22 minutes 37 seconds  Total Procedure Duration: 0 hours 25 minutes 3 seconds       [facility]

## 2018-05-03 NOTE — Anesthesia Preprocedure Evaluation (Signed)
Anesthesia Evaluation  Patient identified by MRN, date of birth, ID band Patient awake    Reviewed: Allergy & Precautions, H&P , NPO status , Patient's Chart, lab work & pertinent test results  History of Anesthesia Complications Negative for: history of anesthetic complications  Airway Mallampati: II  TM Distance: >3 FB Neck ROM: full    Dental  (+) Chipped, Loose, Poor Dentition   Pulmonary Current Smoker,           Cardiovascular Exercise Tolerance: Good (-) angina(-) Past MI negative cardio ROS       Neuro/Psych Seizures -, Well Controlled,  PSYCHIATRIC DISORDERS    GI/Hepatic negative GI ROS, Neg liver ROS,   Endo/Other  negative endocrine ROS  Renal/GU negative Renal ROS  negative genitourinary   Musculoskeletal   Abdominal   Peds  Hematology negative hematology ROS (+)   Anesthesia Other Findings Past Medical History: No date: Anxiety No date: Seizures ([place]) No date: Stress  History reviewed. No pertinent surgical history.  BMI    Body Mass Index:  25.04 kg/m      Reproductive/Obstetrics negative OB ROS                             Anesthesia Physical Anesthesia Plan  ASA: III  Anesthesia Plan: General   Post-op Pain Management:    Induction: Intravenous  PONV Risk Score and Plan: Propofol infusion and TIVA  Airway Management Planned: Natural Airway and Nasal Cannula  Additional Equipment:   Intra-op Plan:   Post-operative Plan:   Informed Consent: I have reviewed the patients History and Physical, chart, labs and discussed the procedure including the risks, benefits and alternatives for the proposed anesthesia with the patient or authorized representative who has indicated his/her understanding and acceptance.   Dental Advisory Given  Plan Discussed with: Anesthesiologist, CRNA and Surgeon  Anesthesia Plan Comments: (Patient consented for risks of  anesthesia including but not limited to:  - adverse reactions to medications - risk of intubation if required - damage to teeth, lips or other oral mucosa - sore throat or hoarseness - Damage to heart, brain, lungs or loss of life  Patient voiced understanding.)        Anesthesia Quick Evaluation

## 2018-05-03 NOTE — Anesthesia Procedure Notes (Signed)
Procedure Name: MAC Date/Time: 05/03/2018 10:10 AM Performed by: Rudean Hitt, CRNA Pre-anesthesia Checklist: Patient identified, Emergency Drugs available, Suction available, Patient being monitored and Timeout performed Patient Re-evaluated:Patient Re-evaluated prior to induction Oxygen Delivery Method: Nasal cannula Induction Type: IV induction

## 2018-05-03 NOTE — H&P (Signed)
Jonathon Bellows, MD [address], [place], [place], [place], [address] [address], [place], [place], [place], [address] Phone: [phone]  Fax: [phone]  Primary Care Physician:  Guadalupe Maple, MD   Pre-Procedure History & Physical: HPI:  Michael Perkins is a 52 y.o. male is here for an colonoscopy.   Past Medical History:  Diagnosis Date  . Anxiety   . Seizures ([place])   . Stress     History reviewed. No pertinent surgical history.  Prior to Admission medications   Medication Sig Start Date End Date Taking? Authorizing Provider  doxycycline (VIBRA-TABS) 100 MG tablet Take 1 tablet (100 mg total) by mouth 2 (two) times daily. Patient not taking: Reported on 05/03/2018 04/18/18   Volney American, PA-C    Allergies as of 04/22/2018 - Review Complete 04/18/2018  Allergen Reaction Noted  . Penicillins Rash 04/18/2018    Family History  Problem Relation Age of Onset  . Diabetes Mother   . Hypertension Mother   . Hypertension Father   . Thyroid disease Father   . Anemia Sister   . Diabetes Maternal Grandfather   . Hypertension Paternal Grandmother     Social History   Socioeconomic History  . Marital status: Single    Spouse name: Not on file  . Number of children: Not on file  . Years of education: Not on file  . Highest education level: Not on file  Occupational History  . Not on file  Social Needs  . Financial resource strain: Not on file  . Food insecurity:    Worry: Not on file    Inability: Not on file  . Transportation needs:    Medical: Not on file    Non-medical: Not on file  Tobacco Use  . Smoking status: Current Some Day Smoker    Types: Cigars  . Smokeless tobacco: Never Used  Substance and Sexual Activity  . Alcohol use: No  . Drug use: No  . Sexual activity: Not Currently  Lifestyle  . Physical activity:    Days per week: Not on file    Minutes per session: Not on file  . Stress: Not on file  Relationships  .  Social connections:    Talks on phone: Not on file    Gets together: Not on file    Attends religious service: Not on file    Active member of club or organization: Not on file    Attends meetings of clubs or organizations: Not on file    Relationship status: Not on file  . Intimate partner violence:    Fear of current or ex partner: Not on file    Emotionally abused: Not on file    Physically abused: Not on file    Forced sexual activity: Not on file  Other Topics Concern  . Not on file  Social History Narrative  . Not on file    Review of Systems: See HPI, otherwise negative ROS  Physical Exam: BP 102/72   Pulse 70   Temp (!) 96.2 F (35.7 C) (Tympanic)   Resp 20   Ht 6\' 2"  (1.88 m)   Wt 88.5 kg   SpO2 100%   BMI 25.04 kg/m  General:   Alert,  pleasant and cooperative in NAD Head:  Normocephalic and atraumatic. Neck:  Supple; no masses or thyromegaly. Lungs:  Clear throughout to auscultation, normal respiratory effort.    Heart:  +S1, +S2, Regular rate and rhythm,  No edema. Abdomen:  Soft, nontender and nondistended. Normal bowel sounds, without guarding, and without rebound.   Neurologic:  Alert and  oriented x4;  grossly normal neurologically.  Impression/Plan: Michael Perkins is here for an colonoscopy to be performed for Screening colonoscopy average risk   Risks, benefits, limitations, and alternatives regarding  colonoscopy have been reviewed with the patient.  Questions have been answered.  All parties agreeable.   Jonathon Bellows, MD  05/03/2018, 9:51 AM

## 2018-05-04 ENCOUNTER — Encounter: Payer: Self-pay | Admitting: Gastroenterology

## 2018-05-08 ENCOUNTER — Encounter: Payer: Self-pay | Admitting: Gastroenterology

## 2018-05-18 ENCOUNTER — Encounter: Payer: Self-pay | Admitting: Family Medicine

## 2018-05-18 ENCOUNTER — Ambulatory Visit (INDEPENDENT_AMBULATORY_CARE_PROVIDER_SITE_OTHER): Payer: PRIVATE HEALTH INSURANCE | Admitting: Family Medicine

## 2018-05-18 VITALS — BP 138/79 | HR 82 | Temp 97.7°F | Ht 73.0 in | Wt 191.0 lb

## 2018-05-18 DIAGNOSIS — Z113 Encounter for screening for infections with a predominantly sexual mode of transmission: Secondary | ICD-10-CM | POA: Diagnosis not present

## 2018-05-18 DIAGNOSIS — M2550 Pain in unspecified joint: Secondary | ICD-10-CM

## 2018-05-18 DIAGNOSIS — R351 Nocturia: Secondary | ICD-10-CM

## 2018-05-18 DIAGNOSIS — Z0000 Encounter for general adult medical examination without abnormal findings: Secondary | ICD-10-CM

## 2018-05-18 DIAGNOSIS — Z8042 Family history of malignant neoplasm of prostate: Secondary | ICD-10-CM

## 2018-05-18 LAB — UA/M W/RFLX CULTURE, ROUTINE
BILIRUBIN UA: NEGATIVE
GLUCOSE, UA: NEGATIVE
KETONES UA: NEGATIVE
Leukocytes, UA: NEGATIVE
Nitrite, UA: NEGATIVE
Protein, UA: NEGATIVE
RBC UA: NEGATIVE
SPEC GRAV UA: 1.02 (ref 1.005–1.030)
UUROB: 2 mg/dL — AB (ref 0.2–1.0)
pH, UA: 7 (ref 5.0–7.5)

## 2018-05-18 MED ORDER — DICLOFENAC SODIUM 1 % TD GEL: 2.0000 g | Freq: Four times a day (QID) | TRANSDERMAL | 3 refills | Status: AC

## 2018-05-18 NOTE — Progress Notes (Signed)
BP 138/79   Pulse 82   Temp 97.7 F (36.5 C) (Oral)   Ht 6\' 1"  (1.854 m)   Wt 191 lb (86.6 kg)   SpO2 99%   BMI 25.20 kg/m    Subjective:    Patient ID: Michael Perkins, male    DOB: 02-26-1966, 52 y.o.   MRN: [id number]  HPI: [name] is a 52 y.o. male presenting on 05/18/2018 for comprehensive medical examination. Current medical complaints include:see below  Joint stiffness and aches flared back up the past week but not nearly as bad as before the doxycycine. Worst in b/l hands, sometimes in shoulder and ankle. Occasional swelling at sites, sometimes having fevers with it. Not taking much OTC, does help when he does take it. This has been ongoing for years per patient with all negative work-ups. Now affecting his ability to exercise which bothers him greatly.   Also having some increased nocturia. Hx of prostatitis. Not having much discomfort, no low back pain, dysuria, hematuria. Does have a fhx of prostate cancer, unsure when his last PSA was drawn.   He currently lives with: Interim Problems from his last visit: yes  Depression Screen done today and results listed below:  Depression screen [facility] 2/9 05/18/2018 04/18/2018  Decreased Interest 1 2  Down, Depressed, Hopeless 1 3  PHQ - 2 Score 2 5  Altered sleeping 2 3  Tired, decreased energy 2 3  Change in appetite 1 0  Feeling bad or failure about yourself  0 0  Trouble concentrating 0 0  Moving slowly or fidgety/restless 0 2  Suicidal thoughts 0 0  PHQ-9 Score 7 13  Difficult doing work/chores - Very difficult    The patient does not have a history of falls. I did not complete a risk assessment for falls. A plan of care for falls was not documented.   Past Medical History:  Past Medical History:  Diagnosis Date  . Anxiety   . Seizures ([place])   . Stress     Surgical History:  Past Surgical History:  Procedure Laterality Date  . COLONOSCOPY WITH PROPOFOL N/A 05/03/2018   Procedure: COLONOSCOPY WITH  PROPOFOL;  Surgeon: Jonathon Bellows, MD;  Location: [facility] ENDOSCOPY;  Service: Gastroenterology;  Laterality: N/A;    Medications:  No current outpatient medications on file prior to visit.   No current facility-administered medications on file prior to visit.     Allergies:  Allergies  Allergen Reactions  . Penicillins Rash    Social History:  Social History   Socioeconomic History  . Marital status: Single    Spouse name: Not on file  . Number of children: Not on file  . Years of education: Not on file  . Highest education level: Not on file  Occupational History  . Not on file  Social Needs  . Financial resource strain: Not on file  . Food insecurity:    Worry: Not on file    Inability: Not on file  . Transportation needs:    Medical: Not on file    Non-medical: Not on file  Tobacco Use  . Smoking status: Current Some Day Smoker    Types: Cigars  . Smokeless tobacco: Never Used  Substance and Sexual Activity  . Alcohol use: No  . Drug use: No  . Sexual activity: Not Currently  Lifestyle  . Physical activity:    Days per week: Not on file    Minutes per session: Not on file  .  Stress: Not on file  Relationships  . Social connections:    Talks on phone: Not on file    Gets together: Not on file    Attends religious service: Not on file    Active member of club or organization: Not on file    Attends meetings of clubs or organizations: Not on file    Relationship status: Not on file  . Intimate partner violence:    Fear of current or ex partner: Not on file    Emotionally abused: Not on file    Physically abused: Not on file    Forced sexual activity: Not on file  Other Topics Concern  . Not on file  Social History Narrative  . Not on file   Social History   Tobacco Use  Smoking Status Current Some Day Smoker  . Types: Cigars  Smokeless Tobacco Never Used   Social History   Substance and Sexual Activity  Alcohol Use No    Family History:    Family History  Problem Relation Age of Onset  . Diabetes Mother   . Hypertension Mother   . Hypertension Father   . Thyroid disease Father   . Anemia Sister   . Diabetes Maternal Grandfather   . Hypertension Paternal Grandmother     Past medical history, surgical history, medications, allergies, family history and social history reviewed with patient today and changes made to appropriate areas of the chart.   Review of Systems - General ROS: negative Psychological ROS: negative Ophthalmic ROS: negative ENT ROS: negative Allergy and Immunology ROS: negative Hematological and Lymphatic ROS: negative Endocrine ROS: negative Respiratory ROS: no cough, shortness of breath, or wheezing Cardiovascular ROS: no chest pain or dyspnea on exertion Gastrointestinal ROS: no abdominal pain, change in bowel habits, or black or bloody stools Genito-Urinary ROS: positive for - nocturia Musculoskeletal ROS: positive for - joint pain, joint stiffness and joint swelling Neurological ROS: no TIA or stroke symptoms Dermatological ROS: negative All other ROS negative except what is listed above and in the HPI.      Objective:    BP 138/79   Pulse 82   Temp 97.7 F (36.5 C) (Oral)   Ht 6\' 1"  (1.854 m)   Wt 191 lb (86.6 kg)   SpO2 99%   BMI 25.20 kg/m   Wt Readings from Last 3 Encounters:  05/18/18 191 lb (86.6 kg)  05/03/18 195 lb (88.5 kg)  04/18/18 187 lb 2 oz (84.9 kg)    Physical Exam  Constitutional: He is oriented to person, place, and time. He appears well-developed and well-nourished. No distress.  HENT:  Head: Atraumatic.  Right Ear: External ear normal.  Left Ear: External ear normal.  Nose: Nose normal.  Mouth/Throat: Oropharynx is clear and moist.  Eyes: Pupils are equal, round, and reactive to light. Conjunctivae are normal. No scleral icterus.  Neck: Normal range of motion. Neck supple.  Cardiovascular: Normal rate, regular rhythm, normal heart sounds and intact distal  pulses.  No murmur heard. Pulmonary/Chest: Effort normal and breath sounds normal. No respiratory distress.  Abdominal: Soft. Bowel sounds are normal. He exhibits no distension and no mass. There is no tenderness. There is no guarding.  Genitourinary:  Genitourinary Comments: Prostate mildly boggy and ttp, no nodules palpable  Musculoskeletal: Normal range of motion. He exhibits no edema or tenderness.  Neurological: He is alert and oriented to person, place, and time. He has normal reflexes.  Skin: Skin is warm and dry. No rash  noted.  Psychiatric: He has a normal mood and affect. His behavior is normal.  Nursing note and vitals reviewed.   Results for orders placed or performed in visit on 05/18/18  CBC with Differential/Platelet  Result Value Ref Range   WBC 5.6 3.4 - 10.8 x10E3/uL   RBC 4.49 4.14 - 5.80 x10E6/uL   Hemoglobin 13.4 13.0 - 17.7 g/dL   Hematocrit 39.5 37.5 - 51.0 %   MCV 88 79 - 97 fL   MCH 29.8 26.6 - 33.0 pg   MCHC 33.9 31.5 - 35.7 g/dL   RDW 13.8 12.3 - 15.4 %   Platelets 188 150 - 450 x10E3/uL   Neutrophils 56 Not Estab. %   Lymphs 32 Not Estab. %   Monocytes 8 Not Estab. %   Eos 3 Not Estab. %   Basos 1 Not Estab. %   Neutrophils Absolute 3.1 1.4 - 7.0 x10E3/uL   Lymphocytes Absolute 1.8 0.7 - 3.1 x10E3/uL   Monocytes Absolute 0.5 0.1 - 0.9 x10E3/uL   EOS (ABSOLUTE) 0.2 0.0 - 0.4 x10E3/uL   Basophils Absolute 0.1 0.0 - 0.2 x10E3/uL   Immature Granulocytes 0 Not Estab. %   Immature Grans (Abs) 0.0 0.0 - 0.1 x10E3/uL  Comprehensive metabolic panel  Result Value Ref Range   Glucose 93 65 - 99 mg/dL   BUN 19 6 - 24 mg/dL   Creatinine, Ser 1.20 0.76 - 1.27 mg/dL   GFR calc non Af Amer 69 >59 mL/min/1.73   GFR calc Af Amer 79 >59 mL/min/1.73   BUN/Creatinine Ratio 16 9 - 20   Sodium 142 134 - 144 mmol/L   Potassium 4.3 3.5 - 5.2 mmol/L   Chloride 106 96 - 106 mmol/L   CO2 24 20 - 29 mmol/L   Calcium 9.7 8.7 - 10.2 mg/dL   Total Protein 6.8 6.0 - 8.5  g/dL   Albumin 4.3 3.5 - 5.5 g/dL   Globulin, Total 2.5 1.5 - 4.5 g/dL   Albumin/Globulin Ratio 1.7 1.2 - 2.2   Bilirubin Total <0.2 0.0 - 1.2 mg/dL   Alkaline Phosphatase 78 39 - 117 IU/L   AST 23 0 - 40 IU/L   ALT 13 0 - 44 IU/L  Lipid Panel w/o Chol/HDL Ratio  Result Value Ref Range   Cholesterol, Total 241 (H) 100 - 199 mg/dL   Triglycerides 223 (H) 0 - 149 mg/dL   HDL 54 >39 mg/dL   VLDL Cholesterol Cal 45 (H) 5 - 40 mg/dL   LDL Calculated 142 (H) 0 - 99 mg/dL  TSH  Result Value Ref Range   TSH 2.810 0.450 - 4.500 uIU/mL  UA/M w/rflx Culture, Routine  Result Value Ref Range   Specific Gravity, UA 1.020 1.005 - 1.030   pH, UA 7.0 5.0 - 7.5   Color, UA Yellow Yellow   Appearance Ur Clear Clear   Leukocytes, UA Negative Negative   Protein, UA Negative Negative/Trace   Glucose, UA Negative Negative   Ketones, UA Negative Negative   RBC, UA Negative Negative   Bilirubin, UA Negative Negative   Urobilinogen, Ur 2.0 (H) 0.2 - 1.0 mg/dL   Nitrite, UA Negative Negative  RPR  Result Value Ref Range   RPR Ser Ql Non Reactive Non Reactive  HSV(herpes simplex vrs) 1+2 ab-IgG  Result Value Ref Range   HSV 1 Glycoprotein G Ab, IgG 22.90 (H) 0.00 - 0.90 index   HSV 2 IgG, Type Spec 3.56 (H) 0.00 - 0.90 index  PSA  Result Value Ref Range   Prostate Specific Ag, Serum 2.4 0.0 - 4.0 ng/mL  HSV-2 IgG Supplemental Test  Result Value Ref Range   HSV-2 IgG Supplemental Test Positive (A) Negative      Assessment & Plan:   Problem List Items Addressed This Visit    None    Visit Diagnoses    Nocturia    -  Primary   Possibly recurrence of prostatitis, U/A neg today - conitnue to monitor, start abx if worsening. Check PSA, STI panel today. Push fluids, OTC pain relievers prn   Relevant Orders   PSA (Completed)   Annual physical exam       Relevant Orders   CBC with Differential/Platelet (Completed)   Comprehensive metabolic panel (Completed)   Lipid Panel w/o Chol/HDL Ratio  (Completed)   TSH (Completed)   UA/M w/rflx Culture, Routine (Completed)   Family history of prostate cancer       Arthralgia, unspecified joint       Given chronicity and inflammation/fevers unclear if simply arthritic or if underlying autoimmune condition. Will refer to rheumatology and tx w/ diclofenac gel   Relevant Orders   Ambulatory referral to Endocrinology   Screening for STD (sexually transmitted disease)       Relevant Orders   RPR (Completed)   HSV(herpes simplex vrs) 1+2 ab-IgG (Completed)   GC/Chlamydia Probe Amp       Discussed aspirin prophylaxis for myocardial infarction prevention and decision was it was not indicated  LABORATORY TESTING:  Health maintenance labs ordered today as discussed above.   The natural history of prostate cancer and ongoing controversy regarding screening and potential treatment outcomes of prostate cancer has been discussed with the patient. The meaning of a false positive PSA and a false negative PSA has been discussed. He indicates understanding of the limitations of this screening test and wishes to proceed with screening PSA testing.   IMMUNIZATIONS:   - Tdap: Tetanus vaccination status reviewed: refused. - Influenza: Refused  SCREENING: - Colonoscopy: Up to date  Discussed with patient purpose of the colonoscopy is to detect colon cancer at curable precancerous or early stages   PATIENT COUNSELING:    Sexuality: Discussed sexually transmitted diseases, partner selection, use of condoms, avoidance of unintended pregnancy  and contraceptive alternatives.   Advised to avoid cigarette smoking.  I discussed with the patient that most people either abstain from alcohol or drink within safe limits (<=14/week and <=4 drinks/occasion for males, <=7/weeks and <= 3 drinks/occasion for females) and that the risk for alcohol disorders and other health effects rises proportionally with the number of drinks per week and how often a drinker  exceeds daily limits.  Discussed cessation/primary prevention of drug use and availability of treatment for abuse.   Diet: Encouraged to adjust caloric intake to maintain  or achieve ideal body weight, to reduce intake of dietary saturated fat and total fat, to limit sodium intake by avoiding high sodium foods and not adding table salt, and to maintain adequate dietary potassium and calcium preferably from fresh fruits, vegetables, and low-fat dairy products.    stressed the importance of regular exercise  Injury prevention: Discussed safety belts, safety helmets, smoke detector, smoking near bedding or upholstery.   Dental health: Discussed importance of regular tooth brushing, flossing, and dental visits.   Follow up plan: NEXT PREVENTATIVE PHYSICAL DUE IN 1 YEAR. Sooner if current issues worsen Return in about 1 year (around 05/19/2019) for CPE.

## 2018-05-20 LAB — COMPREHENSIVE METABOLIC PANEL
A/G RATIO: 1.7 (ref 1.2–2.2)
ALT: 13 IU/L (ref 0–44)
AST: 23 IU/L (ref 0–40)
Albumin: 4.3 g/dL (ref 3.5–5.5)
Alkaline Phosphatase: 78 IU/L (ref 39–117)
BUN / CREAT RATIO: 16 (ref 9–20)
BUN: 19 mg/dL (ref 6–24)
Bilirubin Total: <0.2 mg/dL (ref 0.0–1.2)
CALCIUM: 9.7 mg/dL (ref 8.7–10.2)
CO2: 24 mmol/L (ref 20–29)
Chloride: 106 mmol/L (ref 96–106)
Creatinine, Ser: 1.2 mg/dL (ref 0.76–1.27)
GFR, EST AFRICAN AMERICAN: 79 mL/min/{1.73_m2} (ref >59)
GFR, EST NON AFRICAN AMERICAN: 69 mL/min/{1.73_m2} (ref >59)
GLOBULIN, TOTAL: 2.5 g/dL (ref 1.5–4.5)
Glucose: 93 mg/dL (ref 65–99)
POTASSIUM: 4.3 mmol/L (ref 3.5–5.2)
SODIUM: 142 mmol/L (ref 134–144)
TOTAL PROTEIN: 6.8 g/dL (ref 6.0–8.5)

## 2018-05-20 LAB — LIPID PANEL W/O CHOL/HDL RATIO
Cholesterol, Total: 241 mg/dL — ABNORMAL HIGH (ref 100–199)
HDL: 54 mg/dL (ref >39)
LDL Calculated: 142 mg/dL — ABNORMAL HIGH (ref 0–99)
Triglycerides: 223 mg/dL — ABNORMAL HIGH (ref 0–149)
VLDL Cholesterol Cal: 45 mg/dL — ABNORMAL HIGH (ref 5–40)

## 2018-05-20 LAB — CBC WITH DIFFERENTIAL/PLATELET
Basophils Absolute: 0.1 10*3/uL (ref 0.0–0.2)
Basos: 1 %
EOS (ABSOLUTE): 0.2 10*3/uL (ref 0.0–0.4)
Eos: 3 %
HEMATOCRIT: 39.5 % (ref 37.5–51.0)
Hemoglobin: 13.4 g/dL (ref 13.0–17.7)
IMMATURE GRANS (ABS): 0 10*3/uL (ref 0.0–0.1)
IMMATURE GRANULOCYTES: 0 %
LYMPHS: 32 %
Lymphocytes Absolute: 1.8 10*3/uL (ref 0.7–3.1)
MCH: 29.8 pg (ref 26.6–33.0)
MCHC: 33.9 g/dL (ref 31.5–35.7)
MCV: 88 fL (ref 79–97)
MONOS ABS: 0.5 10*3/uL (ref 0.1–0.9)
Monocytes: 8 %
NEUTROS ABS: 3.1 10*3/uL (ref 1.4–7.0)
NEUTROS PCT: 56 %
Platelets: 188 10*3/uL (ref 150–450)
RBC: 4.49 x10E6/uL (ref 4.14–5.80)
RDW: 13.8 % (ref 12.3–15.4)
WBC: 5.6 10*3/uL (ref 3.4–10.8)

## 2018-05-20 LAB — PSA: PROSTATE SPECIFIC AG, SERUM: 2.4 ng/mL (ref 0.0–4.0)

## 2018-05-20 LAB — HSV(HERPES SIMPLEX VRS) I + II AB-IGG
HSV 1 GLYCOPROTEIN G AB, IGG: 22.9 {index} — AB (ref 0.00–0.90)
HSV 2 IgG, Type Spec: 3.56 index — ABNORMAL HIGH (ref 0.00–0.90)

## 2018-05-20 LAB — HSV-2 IGG SUPPLEMENTAL TEST: HSV-2 IgG Supplemental Test: POSITIVE — AB

## 2018-05-20 LAB — TSH: TSH: 2.81 u[IU]/mL (ref 0.450–4.500)

## 2018-05-20 LAB — RPR: RPR: NONREACTIVE

## 2018-05-25 ENCOUNTER — Other Ambulatory Visit: Payer: Self-pay | Admitting: Family Medicine

## 2018-05-25 DIAGNOSIS — M2550 Pain in unspecified joint: Secondary | ICD-10-CM

## 2018-05-26 LAB — SURGICAL PATHOLOGY

## 2018-06-07 ENCOUNTER — Telehealth: Payer: Self-pay | Admitting: Family Medicine

## 2018-06-07 NOTE — Telephone Encounter (Signed)
Copied from [place] [phone]. Topic: Quick Communication - See Telephone Encounter >> Jun 07, 2018  2:45 PM Antonieta Iba C wrote: CRM for notification. See Telephone encounter for: 06/07/18.  [facility] called in because they received the referral for pt. The providers at [facility] feels that the reasoning for the referral is not a Endocrine issue. They would like to be sure that the provider meant to have referral sent to them?   CB: [phone]

## 2018-06-08 NOTE — Telephone Encounter (Signed)
Called [facility] back to let them know to please disregard referral from [place] regarding this patient.

## 2018-06-08 NOTE — Telephone Encounter (Signed)
My mistake, it was supposed to be Rheumatology - must have clicked the wrong button. Looks like a new referral was already placed to go to the right place so please disregard the endocrinology referral

## 2018-07-22 ENCOUNTER — Ambulatory Visit: Payer: PRIVATE HEALTH INSURANCE

## 2023-11-02 ENCOUNTER — Telehealth: Payer: Self-pay

## 2023-11-02 ENCOUNTER — Other Ambulatory Visit: Payer: Self-pay

## 2023-11-02 DIAGNOSIS — Z860100 Personal history of colon polyps, unspecified: Secondary | ICD-10-CM

## 2023-11-02 MED ORDER — NA SULFATE-K SULFATE-MG SULF 17.5-3.13-1.6 GM/177ML PO SOLN: 1.0000 | Freq: Once | ORAL | 0 refills | Status: AC

## 2023-11-02 NOTE — Telephone Encounter (Signed)
 The patient called in to schedule his colonoscopy.

## 2023-11-02 NOTE — Telephone Encounter (Signed)
 Gastroenterology Pre-Procedure Review  Request Date: 11/25/23 Requesting Physician: Dr. Ole Berkeley  PATIENT REVIEW QUESTIONS: The patient responded to the following health history questions as indicated:    1. Are you having any GI issues? no 2. Do you have a personal history of Polyps? yes (last colonoscopy performed by Dr. Antony Baumgartner on 05/03/18 recommended repeat in 3 years) 3. Do you have a family history of Colon Cancer or Polyps? no 4. Diabetes Mellitus? no 5. Joint replacements in the past 12 months?no 6. Major health problems in the past 3 months?no 7. Any artificial heart valves, MVP, or defibrillator?no    MEDICATIONS & ALLERGIES:    Patient reports the following regarding taking any anticoagulation/antiplatelet therapy:   Plavix, Coumadin, Eliquis, Xarelto, Lovenox, Pradaxa, Brilinta, or Effient? no Aspirin? no  Patient confirms/reports the following medications:  Current Outpatient Medications  Medication Sig Dispense Refill   Na Sulfate-K Sulfate-Mg Sulfate concentrate (SUPREP) 17.5-3.13-1.6 GM/177ML SOLN Take 1 kit (354 mLs total) by mouth once for 1 dose. 354 mL 0   diclofenac  sodium (VOLTAREN ) 1 % GEL Apply 2 g topically 4 (four) times daily. 100 g 3   No current facility-administered medications for this visit.    Patient confirms/reports the following allergies:  Allergies  Allergen Reactions   Penicillins Rash    Orders Placed This Encounter  Procedures   Ambulatory referral to Gastroenterology    Referral Priority:   Routine    Referral Type:   Consultation    Referral Reason:   Specialty Services Required    Referred to Provider:   Luke Salaam, MD    Number of Visits Requested:   1    AUTHORIZATION INFORMATION Primary Insurance: 1D#: Group #:  Secondary Insurance: 1D#: Group #:  SCHEDULE INFORMATION: Date: 11/25/23 Time: Location: [facility]

## 2023-11-24 NOTE — Telephone Encounter (Signed)
 I located the phone number the patient called from and updated it in the note. The number I spoke with him on was [phone].

## 2023-11-24 NOTE — Telephone Encounter (Signed)
 The patient called in to reschedule his procedure.

## 2023-11-24 NOTE — Telephone Encounter (Signed)
 Phone number updated in demographics. Pt colonoscopy has been rescheduled from 11/25/23 with Dr. Ole Berkeley to 12/21/23 with Dr. Ole Berkeley.  Trish in Endo notified.  Instructions updated and printed for mail.  Referral has been updated.  Thanks, [place], CMA

## 2023-12-02 ENCOUNTER — Encounter: Payer: Self-pay | Admitting: Urology

## 2023-12-02 ENCOUNTER — Ambulatory Visit: Payer: PRIVATE HEALTH INSURANCE | Admitting: Urology

## 2023-12-02 VITALS — BP 138/72 | HR 85 | Ht 74.0 in | Wt 184.0 lb

## 2023-12-02 DIAGNOSIS — R6882 Decreased libido: Secondary | ICD-10-CM | POA: Diagnosis not present

## 2023-12-02 DIAGNOSIS — N5082 Scrotal pain: Secondary | ICD-10-CM | POA: Diagnosis not present

## 2023-12-02 LAB — URINALYSIS, COMPLETE
Bilirubin, UA: NEGATIVE
Glucose, UA: NEGATIVE
Ketones, UA: NEGATIVE
Leukocytes,UA: NEGATIVE
Nitrite, UA: NEGATIVE
Protein,UA: NEGATIVE
RBC, UA: NEGATIVE
Specific Gravity, UA: 1.025 (ref 1.005–1.030)
Urobilinogen, Ur: 1 mg/dL (ref 0.2–1.0)
pH, UA: 6 (ref 5.0–7.5)

## 2023-12-02 LAB — MICROSCOPIC EXAMINATION: Bacteria, UA: NONE SEEN

## 2023-12-02 MED ORDER — TAMSULOSIN HCL 0.4 MG PO CAPS: 0.4000 mg | ORAL_CAPSULE | Freq: Every day | ORAL | 3 refills | Status: DC

## 2023-12-02 NOTE — Progress Notes (Signed)
 I, Maysun Jamey Mccallum, acting as a Neurosurgeon for Michael Knapp, MD., have documented all relevant documentation on the behalf of Michael Knapp, MD, as directed by Michael Knapp, MD while in the presence of Michael Knapp, MD.  I have reviewed the above documentation for accuracy and completeness, and I agree with the above.   Michael Knapp, MD  Discussed the use of AI scribe software for clinical note transcription with the patient, who gave verbal consent to proceed.   12/02/2023 5:36 PM   Jettson T Maniaci 1965-12-24 [id number]  Referring provider: Elmyra Haggard, FNP [address] [place],  [place] [address]  Chief Complaint  Patient presents with   New Patient (Initial Visit)    HPI: HAMPTON WIXOM is a 58 y.o. male referred for chronic scrotal pain.  2 year history of intermittent bilateral scrotal pain. No identifiable precipitating, aggravating, or alleviating factors.  Prior urologic evaluation several years ago and states he was diagnosed with prostatitis.  Denies dysuria or gross hematuria. Complains of significant decreased libido, depressed mood, and tiredness/fatigue. Complains of erectile dysfunction. Had a scrotal ultrasound performed in October 2015, which showed no significant abnormalities. Had radiographic bilateral varicoceles and small hydroceles.   PMH: Past Medical History:  Diagnosis Date   Anxiety    Seizures [facility])    Stress     Surgical History: Past Surgical History:  Procedure Laterality Date   COLONOSCOPY WITH PROPOFOL  N/A 05/03/2018   Procedure: COLONOSCOPY WITH PROPOFOL ;  Surgeon: Luke Salaam, MD;  Location: [facility] ENDOSCOPY;  Service: Gastroenterology;  Laterality: N/A;    Home Medications:  Allergies as of 12/02/2023       Reactions   Penicillins Rash        Medication List        Accurate as of Dec 02, 2023  5:36 PM. If you have any questions, ask your nurse or doctor.          diclofenac  sodium 1 %  Gel Commonly known as: VOLTAREN  Apply 2 g topically 4 (four) times daily.   tamsulosin  0.4 MG Caps capsule Commonly known as: FLOMAX  Take 1 capsule (0.4 mg total) by mouth daily. Started by: Michael Perkins        Allergies:  Allergies  Allergen Reactions   Penicillins Rash    Family History: Family History  Problem Relation Age of Onset   Diabetes Mother    Hypertension Mother    Hypertension Father    Thyroid disease Father    Anemia Sister    Diabetes Maternal Grandfather    Hypertension Paternal Grandmother     Social History:  reports that he has been smoking cigars. He has never used smokeless tobacco. He reports that he does not drink alcohol and does not use drugs.   Physical Exam: BP 138/72   Pulse 85   Ht 6\' 2"  (1.88 m)   Wt 184 lb (83.5 kg)   BMI 23.62 kg/m   Constitutional:  Alert and oriented, No acute distress. HEENT: [place] AT Respiratory: Normal respiratory effort, no increased work of breathing GU: Prostate 35 grams, boggy with moderate tenderness; moderate pelvic floor tenderness. Psychiatric: Normal mood and affect.   Urinalysis Dipstick/microscopy negative.   Assessment & Plan:    1. Scrotal Pain Prostate and pelvic floor tenderness on exam; discussed possibility of referred pain secondary to inflammatory prostatitis. Possibility of chronic prostatitis/chronic pelvic pain syndrome discussed. Trial of Tamsulosin  0.4 mg daily prescribed; prescription  sent to pharmacy.  2. Decreased Libido Complains of tiredness, fatigue, and depressed mood. Lab visit scheduled for AM testosterone level to assess for hypogonadism.  I have reviewed the above documentation for accuracy and completeness, and I agree with the above.   Michael Knapp, MD  [facility] [address] [place], [place] [address] [phone]

## 2023-12-08 ENCOUNTER — Other Ambulatory Visit: Payer: Self-pay

## 2023-12-08 DIAGNOSIS — N5082 Scrotal pain: Secondary | ICD-10-CM

## 2023-12-10 ENCOUNTER — Other Ambulatory Visit

## 2023-12-10 DIAGNOSIS — N5082 Scrotal pain: Secondary | ICD-10-CM

## 2023-12-11 ENCOUNTER — Ambulatory Visit: Payer: Self-pay | Admitting: Urology

## 2023-12-11 LAB — TESTOSTERONE: Testosterone: 710 ng/dL (ref 264–916)

## 2023-12-21 ENCOUNTER — Encounter
Admission: RE | Disposition: A | Discharge status: Home / Self Care | Payer: Self-pay | Source: Home / Self Care | Attending: Gastroenterology

## 2023-12-21 ENCOUNTER — Encounter: Payer: Self-pay | Admitting: Gastroenterology

## 2023-12-21 ENCOUNTER — Other Ambulatory Visit: Payer: Self-pay

## 2023-12-21 ENCOUNTER — Ambulatory Visit

## 2023-12-21 ENCOUNTER — Ambulatory Visit
Admission: RE | Admit: 2023-12-21 | Discharge: 2023-12-21 | Disposition: A | Discharge status: Home / Self Care | Attending: Gastroenterology | Admitting: Gastroenterology

## 2023-12-21 DIAGNOSIS — F419 Anxiety disorder, unspecified: Secondary | ICD-10-CM | POA: Insufficient documentation

## 2023-12-21 DIAGNOSIS — K640 First degree hemorrhoids: Secondary | ICD-10-CM | POA: Diagnosis not present

## 2023-12-21 DIAGNOSIS — Z791 Long term (current) use of non-steroidal anti-inflammatories (NSAID): Secondary | ICD-10-CM | POA: Diagnosis not present

## 2023-12-21 DIAGNOSIS — K635 Polyp of colon: Secondary | ICD-10-CM | POA: Diagnosis not present

## 2023-12-21 DIAGNOSIS — Z860100 Personal history of colon polyps, unspecified: Secondary | ICD-10-CM

## 2023-12-21 DIAGNOSIS — F17290 Nicotine dependence, other tobacco product, uncomplicated: Secondary | ICD-10-CM | POA: Insufficient documentation

## 2023-12-21 DIAGNOSIS — Z1211 Encounter for screening for malignant neoplasm of colon: Secondary | ICD-10-CM | POA: Diagnosis present

## 2023-12-21 HISTORY — PX: POLYPECTOMY: SHX149

## 2023-12-21 HISTORY — PX: COLONOSCOPY: SHX5424

## 2023-12-21 SURGERY — COLONOSCOPY
Anesthesia: General

## 2023-12-21 MED ORDER — LIDOCAINE HCL (PF) 2 % IJ SOLN
INTRAMUSCULAR | Status: AC
Start: 2023-12-21 — End: 2023-12-21
  Filled 2023-12-21: qty 5

## 2023-12-21 MED ORDER — PROPOFOL 500 MG/50ML IV EMUL
INTRAVENOUS | Status: DC | PRN
  Administered 2023-12-21: 150 ug/kg/min via INTRAVENOUS

## 2023-12-21 MED ORDER — SODIUM CHLORIDE 0.9 % IV SOLN
INTRAVENOUS | Status: DC
  Administered 2023-12-21: 1000 mL via INTRAVENOUS

## 2023-12-21 MED ORDER — LIDOCAINE HCL (CARDIAC) PF 100 MG/5ML IV SOSY
PREFILLED_SYRINGE | INTRAVENOUS | Status: DC | PRN
  Administered 2023-12-21: 40 mg via INTRAVENOUS

## 2023-12-21 MED ORDER — ONDANSETRON HCL 4 MG/2ML IJ SOLN
INTRAMUSCULAR | Status: AC
  Filled 2023-12-21: qty 2

## 2023-12-21 MED ORDER — ONDANSETRON HCL 4 MG/2ML IJ SOLN
INTRAMUSCULAR | Status: DC | PRN
  Administered 2023-12-21: 4 mg via INTRAVENOUS

## 2023-12-21 MED ORDER — PROPOFOL 10 MG/ML IV BOLUS
INTRAVENOUS | Status: DC | PRN
  Administered 2023-12-21: 100 mg via INTRAVENOUS

## 2023-12-21 NOTE — H&P (Signed)
   Marnee Sink, MD [facility] [address] [place], [place] [address] Phone:[phone] Fax : [phone]  Primary Care Physician:  Cherisse Cornell Primary Gastroenterologist:  Dr. Ole Berkeley  Pre-Procedure History & Physical: HPI:  Michael Perkins is a 58 y.o. male is here for an colonoscopy.   Past Medical History:  Diagnosis Date   Anxiety    Seizures ([facility])    Stress     Past Surgical History:  Procedure Laterality Date   COLONOSCOPY WITH PROPOFOL  N/A 05/03/2018   Procedure: COLONOSCOPY WITH PROPOFOL ;  Surgeon: Luke Salaam, MD;  Location: [facility] ENDOSCOPY;  Service: Gastroenterology;  Laterality: N/A;    Prior to Admission medications   Medication Sig Start Date End Date Taking? Authorizing Provider  tamsulosin  (FLOMAX ) 0.4 MG CAPS capsule Take 1 capsule (0.4 mg total) by mouth daily. 12/02/23  Yes Stoioff, Kizzie Perks, MD  diclofenac  sodium (VOLTAREN ) 1 % GEL Apply 2 g topically 4 (four) times daily. 05/18/18   Corbin Dess, PA-C    Allergies as of 11/02/2023 - Review Complete 05/18/2018  Allergen Reaction Noted   Penicillins Rash 04/18/2018    Family History  Problem Relation Age of Onset   Diabetes Mother    Hypertension Mother    Hypertension Father    Thyroid disease Father    Anemia Sister    Diabetes Maternal Grandfather    Hypertension Paternal Grandmother     Social History   Socioeconomic History   Marital status: Single    Spouse name: Not on file   Number of children: Not on file   Years of education: Not on file   Highest education level: Not on file  Occupational History   Not on file  Tobacco Use   Smoking status: Some Days    Types: Cigars   Smokeless tobacco: Never  Vaping Use   Vaping status: Never Used  Substance and Sexual Activity   Alcohol use: No   Drug use: No   Sexual activity: Not Currently  Other Topics Concern   Not on file  Social History Narrative   Not on file   Social Drivers of Health   Financial  Resource Strain: Not on file  Food Insecurity: Not on file  Transportation Needs: Not on file  Physical Activity: Not on file  Stress: Not on file  Social Connections: Not on file  Intimate Partner Violence: Not on file    Review of Systems: See HPI, otherwise negative ROS  Physical Exam: There were no vitals taken for this visit. General:   Alert,  pleasant and cooperative in NAD Head:  Normocephalic and atraumatic. Neck:  Supple; no masses or thyromegaly. Lungs:  Clear throughout to auscultation.    Heart:  Regular rate and rhythm. Abdomen:  Soft, nontender and nondistended. Normal bowel sounds, without guarding, and without rebound.   Neurologic:  Alert and  oriented x4;  grossly normal neurologically.  Impression/Plan: PAO HAFFEY is here for an colonoscopy to be performed for a history of adenomatous polyps on 2019   Risks, benefits, limitations, and alternatives regarding  colonoscopy have been reviewed with the patient.  Questions have been answered.  All parties agreeable.   Marnee Sink, MD  12/21/2023, 10:17 AM

## 2023-12-21 NOTE — Anesthesia Preprocedure Evaluation (Signed)
 Anesthesia Evaluation  Patient identified by MRN, date of birth, ID band Patient awake    Reviewed: Allergy & Precautions, NPO status , Patient's Chart, lab work & pertinent test results  History of Anesthesia Complications Negative for: history of anesthetic complications  Airway Mallampati: IV  TM Distance: >3 FB Neck ROM: Full    Dental no notable dental hx. (+) Teeth Intact   Pulmonary neg sleep apnea, neg COPD, Current Smoker and Patient abstained from smoking.   Pulmonary exam normal breath sounds clear to auscultation       Cardiovascular Exercise Tolerance: Good METS(-) hypertension(-) CAD and (-) Past MI negative cardio ROS (-) dysrhythmias  Rhythm:Regular Rate:Normal - Systolic murmurs    Neuro/Psych neg Seizures PSYCHIATRIC DISORDERS Anxiety     negative neurological ROS     GI/Hepatic ,neg GERD  ,,(+)     (-) substance abuse    Endo/Other  neg diabetes    Renal/GU negative Renal ROS     Musculoskeletal   Abdominal   Peds  Hematology   Anesthesia Other Findings Past Medical History: No date: Anxiety No date: Seizures (HCC) No date: Stress  Reproductive/Obstetrics                             Anesthesia Physical Anesthesia Plan  ASA: 2  Anesthesia Plan: General   Post-op Pain Management: Minimal or no pain anticipated   Induction: Intravenous  PONV Risk Score and Plan: 1 and Propofol  infusion, TIVA and Ondansetron  Airway Management Planned: Nasal Cannula  Additional Equipment: None  Intra-op Plan:   Post-operative Plan:   Informed Consent: I have reviewed the patients History and Physical, chart, labs and discussed the procedure including the risks, benefits and alternatives for the proposed anesthesia with the patient or authorized representative who has indicated his/her understanding and acceptance.     Dental advisory given  Plan Discussed with: CRNA  and Surgeon  Anesthesia Plan Comments: (Discussed risks of anesthesia with patient, including possibility of difficulty with spontaneous ventilation under anesthesia necessitating airway intervention, PONV, and rare risks such as cardiac or respiratory or neurological events, and allergic reactions. Discussed the role of CRNA in patient's perioperative care. Patient understands. Patient counseled on benefits of smoking cessation, and increased perioperative risks associated with continued smoking. )       Anesthesia Quick Evaluation

## 2023-12-21 NOTE — Op Note (Signed)
 [facility] Patient Name: Michael Perkins Procedure Date: 12/21/2023 10:35 AM MRN: [id number] Account #: [id number] Date of Birth: December 06, 1965 Admit Type: Outpatient Age: 58 Room: [facility] ENDO ROOM 4 Gender: Male Note Status: Finalized Instrument Name: Charlyn Cooley [id number] Procedure:             Colonoscopy Indications:           High risk colon cancer surveillance: Personal history                         of colonic polyps Providers:             Marnee Sink MD, MD Medicines:             Propofol  per Anesthesia Complications:         No immediate complications. Procedure:             Pre-Anesthesia Assessment:                        - Prior to the procedure, a History and Physical was                         performed, and patient medications and allergies were                         reviewed. The patient's tolerance of previous                         anesthesia was also reviewed. The risks and benefits                         of the procedure and the sedation options and risks                         were discussed with the patient. All questions were                         answered, and informed consent was obtained. Prior                         Anticoagulants: The patient has taken no anticoagulant                         or antiplatelet agents. ASA Grade Assessment: II - A                         patient with mild systemic disease. After reviewing                         the risks and benefits, the patient was deemed in                         satisfactory condition to undergo the procedure.                        After obtaining informed consent, the colonoscope was                         passed under direct vision. Throughout  the procedure,                         the patient's blood pressure, pulse, and oxygen                         saturations were monitored continuously. The                         Colonoscope was introduced through the  anus and                         advanced to the the cecum, identified by appendiceal                         orifice and ileocecal valve. The colonoscopy was                         performed without difficulty. The patient tolerated                         the procedure well. The quality of the bowel                         preparation was adequate to identify polyps. Findings:      The perianal and digital rectal examinations were normal.      A 3 mm polyp was found in the sigmoid colon. The polyp was sessile. The       polyp was removed with a cold biopsy forceps. Resection and retrieval       were complete.      Non-bleeding internal hemorrhoids were found during retroflexion. The       hemorrhoids were Grade I (internal hemorrhoids that do not prolapse). Impression:            - One 3 mm polyp in the sigmoid colon, removed with a                         cold biopsy forceps. Resected and retrieved.                        - Non-bleeding internal hemorrhoids. Recommendation:        - Discharge patient to home.                        - Resume previous diet.                        - Continue present medications.                        - Await pathology results.                        - Repeat colonoscopy in 5 years for surveillance. Procedure Code(s):     --- Professional ---                        [phone], Colonoscopy, flexible; with biopsy, single or  multiple Diagnosis Code(s):     --- Professional ---                        Z86.010, Personal history of colonic polyps                        D12.5, Benign neoplasm of sigmoid colon CPT copyright 2022 [employer]. All rights reserved. The codes documented in this report are preliminary and upon coder review may  be revised to meet current compliance requirements. Marnee Sink MD, MD 12/21/2023 10:51:01 AM This report has been signed electronically. Number of Addenda: 0 Note Initiated On:  12/21/2023 10:35 AM Scope Withdrawal Time: 0 hours 7 minutes 33 seconds  Total Procedure Duration: 0 hours 10 minutes 5 seconds  Estimated Blood Loss:  Estimated blood loss: none.      [facility]

## 2023-12-21 NOTE — Anesthesia Postprocedure Evaluation (Signed)
 Anesthesia Post Note  Patient: Michael Perkins  Procedure(s) Performed: COLONOSCOPY POLYPECTOMY, INTESTINE  Patient location during evaluation: Endoscopy Anesthesia Type: General Level of consciousness: awake and alert Pain management: pain level controlled Vital Signs Assessment: post-procedure vital signs reviewed and stable Respiratory status: spontaneous breathing, nonlabored ventilation, respiratory function stable and patient connected to nasal cannula oxygen Cardiovascular status: blood pressure returned to baseline and stable Postop Assessment: no apparent nausea or vomiting Anesthetic complications: no   No notable events documented.   Last Vitals:  Vitals:   12/21/23 1104 12/21/23 1114  BP: 103/66 118/77  Pulse: 60 62  Resp: 15 10  Temp:    SpO2: 99% 100%    Last Pain:  Vitals:   12/21/23 1104  TempSrc:   PainSc: 0-No pain                 Lattie Poli

## 2023-12-21 NOTE — Transfer of Care (Signed)
 Immediate Anesthesia Transfer of Care Note  Patient: Michael Perkins  Procedure(s) Performed: COLONOSCOPY POLYPECTOMY, INTESTINE  Patient Location: PACU  Anesthesia Type:MAC  Level of Consciousness: awake  Airway & Oxygen Therapy: Patient Spontanous Breathing  Post-op Assessment: Report given to RN and Post -op Vital signs reviewed and stable  Post vital signs: Reviewed and stable  Last Vitals:  Vitals Value Taken Time  BP 90/60 12/21/23 10:54  Temp 35.7 C 12/21/23 10:54  Pulse 60 12/21/23 10:56  Resp 16 12/21/23 10:56  SpO2 98 % 12/21/23 10:56  Vitals shown include unfiled device data.  Last Pain:  Vitals:   12/21/23 1054  TempSrc: Temporal  PainSc: Asleep      Patients Stated Pain Goal: 5 (12/21/23 1026)  Complications: No notable events documented.

## 2023-12-22 ENCOUNTER — Ambulatory Visit: Payer: Self-pay | Admitting: Gastroenterology

## 2023-12-22 LAB — SURGICAL PATHOLOGY

## 2024-04-04 ENCOUNTER — Other Ambulatory Visit: Payer: Self-pay | Admitting: Urology
